# Patient Record
Sex: Female | Born: 1950
Health system: Southern US, Community
[De-identification: ages and names within clinical notes are randomized; demographics above are authoritative.]

## PROBLEM LIST (undated history)

## (undated) DIAGNOSIS — E0590 Thyrotoxicosis, unspecified without thyrotoxic crisis or storm: Secondary | ICD-10-CM

## (undated) HISTORY — DX: Thyrotoxicosis, unspecified without thyrotoxic crisis or storm: E05.90

## (undated) HISTORY — PX: TONSILLECTOMY: SUR1361

---

## 1978-06-13 HISTORY — PX: AUGMENTATION MAMMAPLASTY: SUR837

## 2013-07-01 ENCOUNTER — Other Ambulatory Visit (HOSPITAL_COMMUNITY): Payer: Self-pay | Admitting: *Deleted

## 2013-07-01 DIAGNOSIS — N6310 Unspecified lump in the right breast, unspecified quadrant: Secondary | ICD-10-CM

## 2013-07-02 ENCOUNTER — Encounter (HOSPITAL_COMMUNITY): Payer: Self-pay

## 2013-07-02 ENCOUNTER — Ambulatory Visit (HOSPITAL_COMMUNITY): Payer: Self-pay

## 2013-07-02 ENCOUNTER — Ambulatory Visit (HOSPITAL_COMMUNITY)
Admission: RE | Admit: 2013-07-02 | Discharge: 2013-07-02 | Disposition: A | Discharge status: Home / Self Care | Payer: Self-pay | Source: Ambulatory Visit | Attending: Obstetrics and Gynecology | Admitting: Obstetrics and Gynecology

## 2013-07-02 ENCOUNTER — Encounter (INDEPENDENT_AMBULATORY_CARE_PROVIDER_SITE_OTHER): Payer: Self-pay

## 2013-07-02 VITALS — BP 146/100 | Temp 99.1°F | Ht 64.0 in | Wt 218.6 lb

## 2013-07-02 DIAGNOSIS — Z01419 Encounter for gynecological examination (general) (routine) without abnormal findings: Secondary | ICD-10-CM

## 2013-07-02 DIAGNOSIS — N6310 Unspecified lump in the right breast, unspecified quadrant: Secondary | ICD-10-CM | POA: Insufficient documentation

## 2013-07-02 NOTE — Progress Notes (Signed)
Complaints of a right breast lump x 6 months and new nipple inversion  Pap Smear:  Pap smear completed today. Patients last Pap smear was around 10 years ago per patient and normal. Per patient has no history of an abnormal Pap smear. No Pap smear results in EPIC.  Physical exam: Breasts Patient has a history of bilateral breast implants in 1979 per patient. Breasts symmetrical. No skin abnormalities bilateral breasts. No nipple retraction left breast. Right nipple inversion when lifts arms. No nipple discharge bilateral breasts. No lymphadenopathy left axilla. Lymphadenopathy and redness right axilla. No lumps palpated left breast. Palpated a mass within the right upper outer breast. No complaints of pain or tenderness on exam. Referred patient to the [facility] for diagnostic mammogram. Appointment scheduled for Wednesday, July 10, 2013 at 1030.          Pelvic/Bimanual   Ext Genitalia No lesions, no swelling and no discharge observed on external genitalia.         Vagina Vagina pink and normal texture. No lesions or discharge observed in vagina.          Cervix Cervix is present. Cervix pink and of normal texture. No discharge observed.     Uterus Uterus is present and palpable. Uterus in normal position and normal size.        Adnexae Bilateral ovaries present and palpable. No tenderness on palpation.          Rectovaginal No rectal exam completed today since patient had no rectal complaints. No skin abnormalities observed on exam.

## 2013-07-02 NOTE — Patient Instructions (Signed)
Taught Analysse Quinonez how to perform BSE and gave educational materials to take home. Let her know [facility] will cover Pap smears every 3 years unless has a history of abnormal Pap smears. Referred patient to the [facility] for diagnostic mammogram. Appointment scheduled for Wednesday, July 10, 2013 at 1030. Patient aware of appointment and will be there. Let patient know will follow up with her within the next couple weeks with results of Pap smear by phone. Loneta Tamplin verbalized understanding.  Meg Niemeier, Arvil Chaco, RN 4:25 PM

## 2013-07-03 ENCOUNTER — Encounter (HOSPITAL_COMMUNITY): Payer: Self-pay

## 2013-07-10 ENCOUNTER — Ambulatory Visit
Admission: RE | Admit: 2013-07-10 | Discharge: 2013-07-10 | Disposition: A | Discharge status: Home / Self Care | Payer: No Typology Code available for payment source | Source: Ambulatory Visit | Attending: Obstetrics and Gynecology | Admitting: Obstetrics and Gynecology

## 2013-07-10 ENCOUNTER — Other Ambulatory Visit (HOSPITAL_COMMUNITY): Payer: Self-pay | Admitting: Obstetrics and Gynecology

## 2013-07-10 DIAGNOSIS — N6310 Unspecified lump in the right breast, unspecified quadrant: Secondary | ICD-10-CM

## 2013-09-11 ENCOUNTER — Telehealth (HOSPITAL_COMMUNITY): Payer: Self-pay | Admitting: *Deleted

## 2013-09-11 NOTE — Telephone Encounter (Signed)
Telephoned patient at home # and discussed negative pap smear result. Next pap smear due in 3 years. Patient voiced understanding.

## 2014-04-14 ENCOUNTER — Encounter (HOSPITAL_COMMUNITY): Payer: Self-pay

## 2016-12-31 ENCOUNTER — Encounter (HOSPITAL_COMMUNITY): Payer: Self-pay | Admitting: Emergency Medicine

## 2016-12-31 ENCOUNTER — Emergency Department (HOSPITAL_COMMUNITY): Payer: Medicare Other

## 2016-12-31 ENCOUNTER — Ambulatory Visit (INDEPENDENT_AMBULATORY_CARE_PROVIDER_SITE_OTHER): Payer: Medicare Other | Admitting: Physician Assistant

## 2016-12-31 ENCOUNTER — Other Ambulatory Visit: Payer: Self-pay

## 2016-12-31 ENCOUNTER — Emergency Department (HOSPITAL_COMMUNITY)
Admission: EM | Admit: 2016-12-31 | Discharge: 2016-12-31 | Disposition: A | Discharge status: Home / Self Care | Payer: Medicare Other | Attending: Emergency Medicine | Admitting: Emergency Medicine

## 2016-12-31 ENCOUNTER — Encounter: Payer: Self-pay | Admitting: Physician Assistant

## 2016-12-31 VITALS — BP 150/90 | HR 125 | Temp 98.8°F | Resp 18 | Ht 64.96 in | Wt 222.0 lb

## 2016-12-31 DIAGNOSIS — R600 Localized edema: Secondary | ICD-10-CM | POA: Diagnosis not present

## 2016-12-31 DIAGNOSIS — R000 Tachycardia, unspecified: Secondary | ICD-10-CM | POA: Diagnosis not present

## 2016-12-31 DIAGNOSIS — R002 Palpitations: Secondary | ICD-10-CM

## 2016-12-31 DIAGNOSIS — E0590 Thyrotoxicosis, unspecified without thyrotoxic crisis or storm: Secondary | ICD-10-CM | POA: Diagnosis not present

## 2016-12-31 DIAGNOSIS — I10 Essential (primary) hypertension: Secondary | ICD-10-CM | POA: Diagnosis not present

## 2016-12-31 DIAGNOSIS — R0600 Dyspnea, unspecified: Secondary | ICD-10-CM | POA: Insufficient documentation

## 2016-12-31 DIAGNOSIS — R5383 Other fatigue: Secondary | ICD-10-CM

## 2016-12-31 DIAGNOSIS — R030 Elevated blood-pressure reading, without diagnosis of hypertension: Secondary | ICD-10-CM | POA: Diagnosis not present

## 2016-12-31 LAB — I-STAT TROPONIN, ED: Troponin i, poc: 0.03 ng/mL (ref 0.00–0.08)

## 2016-12-31 LAB — TSH: TSH: <0.01 u[IU]/mL — ABNORMAL LOW (ref 0.350–4.500)

## 2016-12-31 LAB — CBC
HCT: 35.9 % — ABNORMAL LOW (ref 36.0–46.0)
Hemoglobin: 11.5 g/dL — ABNORMAL LOW (ref 12.0–15.0)
MCH: 29.4 pg (ref 26.0–34.0)
MCHC: 32 g/dL (ref 30.0–36.0)
MCV: 91.8 fL (ref 78.0–100.0)
PLATELETS: 205 10*3/uL (ref 150–400)
RBC: 3.91 MIL/uL (ref 3.87–5.11)
RDW: 12.2 % (ref 11.5–15.5)
WBC: 6.4 10*3/uL (ref 4.0–10.5)

## 2016-12-31 LAB — BASIC METABOLIC PANEL
Anion gap: 9 (ref 5–15)
BUN: 11 mg/dL (ref 6–20)
CO2: 23 mmol/L (ref 22–32)
Calcium: 9.4 mg/dL (ref 8.9–10.3)
Chloride: 108 mmol/L (ref 101–111)
Creatinine, Ser: 0.63 mg/dL (ref 0.44–1.00)
GFR calc non Af Amer: >60 mL/min (ref >60)
Glucose, Bld: 84 mg/dL (ref 65–99)
Potassium: 3.8 mmol/L (ref 3.5–5.1)
Sodium: 140 mmol/L (ref 135–145)

## 2016-12-31 LAB — D-DIMER, QUANTITATIVE (NOT AT ARMC): D DIMER QUANT: 1.08 ug{FEU}/mL — AB (ref 0.00–0.50)

## 2016-12-31 MED ORDER — IOPAMIDOL (ISOVUE-370) INJECTION 76%
INTRAVENOUS | Status: AC
  Administered 2016-12-31: 100 mL
  Filled 2016-12-31: qty 100

## 2016-12-31 MED ORDER — METOPROLOL SUCCINATE ER 50 MG PO TB24
50.0000 mg | ORAL_TABLET | Freq: Every day | ORAL | Status: DC
  Administered 2016-12-31: 50 mg via ORAL
  Filled 2016-12-31: qty 1

## 2016-12-31 MED ORDER — METOPROLOL SUCCINATE ER 50 MG PO TB24: 50.0000 mg | ORAL_TABLET | Freq: Every day | ORAL | 1 refills | Status: DC

## 2016-12-31 NOTE — Discharge Instructions (Signed)
I'm concerned that you have hyperthyroidism which is leading to a rapid heart rate. Will prescribe a beta blocker called metoprolol. Recommend following up with an endocrinologist.  Options include [facility] at [phone],  Dr Legrand Como [place] [phone], Dr Roque Cash [phone]

## 2016-12-31 NOTE — Patient Instructions (Addendum)
I would like you to go to [facility] [address] today.      IF you received an x-ray today, you will receive an invoice from [facility] Radiology. Please contact [facility]iology at [phone] with questions or concerns regarding your invoice.   IF you received labwork today, you will receive an invoice from [facility]. Please contact [facility] at [phone] with questions or concerns regarding your invoice.   Our billing staff will not be able to assist you with questions regarding bills from these companies.  You will be contacted with the lab results as soon as they are available. The fastest way to get your results is to activate your My Chart account. Instructions are located on the last page of this paperwork. If you have not heard from [place] regarding the results in 2 weeks, please contact this office.

## 2016-12-31 NOTE — ED Triage Notes (Signed)
[facility] further eval of B/L ankle swelling and irregular heart beat.

## 2016-12-31 NOTE — Progress Notes (Signed)
PRIMARY CARE AT [place], [place] [address] [phone]  Date:  12/31/2016   Name:  Misty Cabrera   DOB:  [birth year]   MRN:  [id number]  PCP:  No primary care provider on file.    History of Present Illness:  Misty Cabrera is a 66 y.o. female patient who presents to PCP with  Chief Complaint  Patient presents with  . Joint Swelling    x 2 days       Yesterday, she noticed left foot swelling.  She now has right foot swelling by this morning.  The sensation is tight but not painful.   Chest discomfort, like palpitations not associated with rest,  No nausea, no sweating.  She has no dizziness.  No actual chest pain.   She has dyspnea during sleep at times.  She sleeps with 2 pillows at times chronically.  She has noticed a dry tickle in her throat, but not heavy coughing.  No abnormal dyspnea or sob.  She does drink energy drinks 1 per day.  She has had one about 4 hours ago. Her job involves her sitting all the times.   She does not regularly see a doctor.   Mother hx of irregular heart beat, deceased.   Father hx unknown.   Rare etOH use.   No tobacco use.  She does not go to a primary provider, and has not had her health evaluated for years. 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    Patient Active Problem List   Diagnosis Date Noted  . Mass of right breast 07/02/2013    History reviewed. No pertinent past medical history.  Past Surgical History:  Procedure Laterality Date  . AUGMENTATION MAMMAPLASTY  1980  . TONSILLECTOMY      Social History  Substance Use Topics  . Smoking status: Never Smoker  . Smokeless tobacco: Never Used  . Alcohol use No    Family History  Problem Relation Age of Onset  . Diabetes Maternal Aunt   . Diabetes Maternal Uncle     No Known Allergies  Medication list has been reviewed and updated.  No current outpatient prescriptions on file prior to visit.   No current  facility-administered medications on file prior to visit.     ROS ROS otherwise unremarkable unless listed above.  Physical Examination: BP (!) 150/90   Pulse (!) 125   Temp 98.8 F (37.1 C) (Oral)   Resp 18   Ht 5' 4.96" (1.65 m)   Wt 222 lb (100.7 kg)   SpO2 95%   BMI 36.99 kg/m  Ideal Body Weight: Weight in (lb) to have BMI = 25: 149.7  Physical Exam  Constitutional: She is oriented to person, place, and time. She appears well-developed and well-nourished. No distress.  HENT:  Head: Normocephalic and atraumatic.  Right Ear: External ear normal.  Left Ear: External ear normal.  Eyes: Pupils are equal, round, and reactive to light. Conjunctivae and EOM are normal.  Cardiovascular: Regular rhythm.  Tachycardia present.  Exam reveals no gallop and no friction rub.   No murmur heard. Pulses:      Dorsalis pedis pulses are 2+ on the right side, and 2+ on the left side.  Lower extremity non-pitting edema.    Pulmonary/Chest: Effort normal. No apnea. No respiratory distress. She has no decreased breath sounds. She has no wheezes. She has no rhonchi.  Neurological: She is alert and oriented to person,  place, and time.  Skin: She is not diaphoretic.  Psychiatric: She has a normal mood and affect. Her behavior is normal.     Assessment and Plan: Misty Cabrera is a 66 y.o. female who is here today for cc of lower extremity swelling. This ekg reviewed with Dr. Linna Darner.  Possible tachycardia without atrial flutter.   Heart rate should be controlled and extremity swelling.  Possible clot can not be ruled out as well  Lower extremity edema - Plan: CBC, CMP14+EGFR, Brain natriuretic peptide, TSH  Palpitations - Plan: EKG 12-Lead, CBC, CMP14+EGFR, Brain natriuretic peptide, TSH  Other fatigue - Plan: EKG 12-Lead, CBC, CMP14+EGFR, Brain natriuretic peptide, TSH  Essential hypertension  Elevated blood pressure reading - Plan: CBC, CMP14+EGFR, Brain natriuretic peptide,  TSH  Tachycardia  Ivar Drape, PA-C [facility] 7/21/20184:07 PM

## 2016-12-31 NOTE — ED Provider Notes (Signed)
[facility] DEPT Provider Note   CSN: [id number] Arrival date & time: 12/31/16  1616     History   Chief Complaint Chief Complaint  Patient presents with  . Irregular Heart Beat  . Joint Swelling    HPI Misty Cabrera is a 66 y.o. female.  Patient complains of swelling in her lower extremities and a rapid heart rate with dyspnea. She is feels stressed out lately. No chest pain, fever, sweats, chills.  She was sent from an urgent care center for further evaluation. Unknown past medical history.  Medications include Naprosyn. Her symptoms are new. Severity is moderate.       History reviewed. No pertinent past medical history.  Patient Active Problem List   Diagnosis Date Noted  . Mass of right breast 07/02/2013    Past Surgical History:  Procedure Laterality Date  . AUGMENTATION MAMMAPLASTY  1980  . TONSILLECTOMY      OB History    Gravida Para Term Preterm AB Living   1 1 1     1    SAB TAB Ectopic Multiple Live Births                   Home Medications    Prior to Admission medications   Medication Sig Start Date End Date Taking? Authorizing Provider  naproxen sodium (ALEVE) 220 MG tablet Take 660 mg by mouth 3 (three) times daily as needed (pain/headache).   Yes [provider]  metoprolol succinate (TOPROL XL) 50 MG 24 hr tablet Take 1 tablet (50 mg total) by mouth daily. Take with or immediately following a meal. 12/31/16   Nat Christen, MD    Family History Family History  Problem Relation Age of Onset  . Diabetes Maternal Aunt   . Diabetes Maternal Uncle     Social History Social History  Substance Use Topics  . Smoking status: Never Smoker  . Smokeless tobacco: Never Used  . Alcohol use No     Allergies   Patient has no known allergies.   Review of Systems Review of Systems  All other systems reviewed and are negative.    Physical Exam Updated Vital Signs BP (!) 143/82   Pulse (!) 121   Temp 98.7 F (37.1 C) (Oral)    Resp 20   Ht 5\' 5"  (1.651 m)   Wt 100.7 kg (222 lb)   SpO2 94%   BMI 36.94 kg/m   Physical Exam  Constitutional: She is oriented to person, place, and time. She appears well-developed and well-nourished.  No acute distress  HENT:  Head: Normocephalic and atraumatic.  Eyes: Conjunctivae are normal.  Neck: Neck supple.  Cardiovascular: Regular rhythm.   Tachycardic  Pulmonary/Chest: Effort normal and breath sounds normal.  Abdominal: Soft. Bowel sounds are normal.  Musculoskeletal: Normal range of motion.  Neurological: She is alert and oriented to person, place, and time.  Skin:  3+ peripheral edema  Psychiatric: She has a normal mood and affect. Her behavior is normal.  Nursing note and vitals reviewed.    ED Treatments / Results  Labs (all labs ordered are listed, but only abnormal results are displayed) Labs Reviewed  CBC - Abnormal; Notable for the following:       Result Value   Hemoglobin 11.5 (*)    HCT 35.9 (*)    All other components within normal limits  TSH - Abnormal; Notable for the following:    TSH <0.010 (*)    All other components within normal  limits  D-DIMER, QUANTITATIVE (NOT AT [facility]) - Abnormal; Notable for the following:    D-Dimer, Quant 1.08 (*)    All other components within normal limits  BASIC METABOLIC PANEL  I-STAT TROPONIN, ED    EKG  EKG Interpretation  Date/Time:  Saturday December 31 2016 16:21:06 EDT Ventricular Rate:  135 PR Interval:  130 QRS Duration: 74 QT Interval:  284 QTC Calculation: 426 R Axis:   41 Text Interpretation:  Sinus tachycardia Possible Left atrial enlargement Borderline ECG Confirmed by Nat Christen [phone]) on 12/31/2016 4:29:45 PM       Radiology Dg Chest 2 View  Result Date: 12/31/2016 CLINICAL DATA:  Irregular heartbeat, bilateral ankle swelling EXAM: CHEST  2 VIEW COMPARISON:  None. FINDINGS: Lungs are clear.  No pleural effusion or pneumothorax. The heart is normal in size. Bilateral breast  augmentation. Visualized osseous structures are within normal limits. IMPRESSION: No evidence of acute cardiopulmonary disease. Electronically Signed   By: Julian Hy M.D.   On: 12/31/2016 17:12   Ct Angio Chest Pe W And/or Wo Contrast  Result Date: 12/31/2016 CLINICAL DATA:  Unexplained tachycardia and peripheral edema. Evaluate for pulmonary embolism. EXAM: CT ANGIOGRAPHY CHEST WITH CONTRAST TECHNIQUE: Multidetector CT imaging of the chest was performed using the standard protocol during bolus administration of intravenous contrast. Multiplanar CT image reconstructions and MIPs were obtained to evaluate the vascular anatomy. CONTRAST:  100 cc Isovue 370 intravenous COMPARISON:  None. FINDINGS: Cardiovascular: Diminished sensitivity due to bolus dispersion, intermittent streak artifact, and intermittent motion but there is adequate opacification to the segmental level at least. No visible pulmonary embolism. Normal heart size. No pericardial effusion. No acute finding in the aorta. Mediastinum/Nodes: Negative for adenopathy or mass. Lungs/Pleura: There is no edema, consolidation, effusion, or pneumothorax. Upper Abdomen: 1 cm low-density in the central liver measures cystic density. Musculoskeletal: No acute or aggressive finding. Bilateral subglandular breast implants with intracapsular rupture. Review of the MIP images confirms the above findings. IMPRESSION: Negative for pulmonary embolism or other acute finding. Electronically Signed   By: Monte Fantasia M.D.   On: 12/31/2016 19:52    Procedures Procedures (including critical care time)  Medications Ordered in ED Medications  metoprolol succinate (TOPROL-XL) 24 hr tablet 50 mg (not administered)  iopamidol (ISOVUE-370) 76 % injection (100 mLs  Contrast Given 12/31/16 1929)     Initial Impression / Assessment and Plan / ED Course  I have reviewed the triage vital signs and the nursing notes.  Pertinent labs & imaging results that were  available during my care of the patient were reviewed by me and considered in my medical decision making (see chart for details).     Patient is tachycardic with a rate of 120-135. Chest x-ray shows no edema. CT angiogram of chest reveals no pulmonary embolism. TSH was ultra low. Suspect hyperthyroidism. Will start metoprolol extended release 50 mg daily. Referral to endocrinologist. This was discussed with the patient and her family.  Final Clinical Impressions(s) / ED Diagnoses   Final diagnoses:  Tachycardia  Hyperthyroidism    New Prescriptions New Prescriptions   METOPROLOL SUCCINATE (TOPROL XL) 50 MG 24 HR TABLET    Take 1 tablet (50 mg total) by mouth daily. Take with or immediately following a meal.     Nat Christen, MD 12/31/16 2214

## 2017-01-02 LAB — CMP14+EGFR
ALBUMIN: 4 g/dL (ref 3.6–4.8)
ALK PHOS: 81 IU/L (ref 39–117)
ALT: 87 IU/L — ABNORMAL HIGH (ref 0–32)
AST: 51 IU/L — ABNORMAL HIGH (ref 0–40)
Albumin/Globulin Ratio: 1.5 (ref 1.2–2.2)
BUN / CREAT RATIO: 19 (ref 12–28)
BUN: 12 mg/dL (ref 8–27)
Bilirubin Total: 0.4 mg/dL (ref 0.0–1.2)
CHLORIDE: 105 mmol/L (ref 96–106)
CO2: 23 mmol/L (ref 20–29)
Calcium: 9.8 mg/dL (ref 8.7–10.3)
Creatinine, Ser: 0.63 mg/dL (ref 0.57–1.00)
GFR calc Af Amer: 108 mL/min/{1.73_m2} (ref >59)
GFR calc non Af Amer: 94 mL/min/{1.73_m2} (ref >59)
GLOBULIN, TOTAL: 2.6 g/dL (ref 1.5–4.5)
Glucose: 84 mg/dL (ref 65–99)
Potassium: 3.9 mmol/L (ref 3.5–5.2)
Sodium: 144 mmol/L (ref 134–144)
Total Protein: 6.6 g/dL (ref 6.0–8.5)

## 2017-01-02 LAB — CBC
Hematocrit: 36.2 % (ref 34.0–46.6)
Hemoglobin: 12 g/dL (ref 11.1–15.9)
MCH: 30.5 pg (ref 26.6–33.0)
MCHC: 33.1 g/dL (ref 31.5–35.7)
MCV: 92 fL (ref 79–97)
Platelets: 253 10*3/uL (ref 150–379)
RBC: 3.94 x10E6/uL (ref 3.77–5.28)
RDW: 12.3 % (ref 12.3–15.4)
WBC: 5.8 10*3/uL (ref 3.4–10.8)

## 2017-01-02 LAB — TSH: TSH: <0.006 u[IU]/mL — ABNORMAL LOW (ref 0.450–4.500)

## 2017-01-02 LAB — BRAIN NATRIURETIC PEPTIDE: BNP: 259 pg/mL — ABNORMAL HIGH (ref 0.0–100.0)

## 2017-02-10 ENCOUNTER — Encounter: Payer: Self-pay | Admitting: Endocrinology

## 2017-02-10 ENCOUNTER — Ambulatory Visit (INDEPENDENT_AMBULATORY_CARE_PROVIDER_SITE_OTHER): Payer: Medicare Other | Admitting: Endocrinology

## 2017-02-10 DIAGNOSIS — E0590 Thyrotoxicosis, unspecified without thyrotoxic crisis or storm: Secondary | ICD-10-CM | POA: Diagnosis not present

## 2017-02-10 MED ORDER — METHIMAZOLE 10 MG PO TABS: 20.0000 mg | ORAL_TABLET | Freq: Three times a day (TID) | ORAL | 5 refills | Status: DC

## 2017-02-10 NOTE — Progress Notes (Signed)
Subjective:    Patient ID: Misty Cabrera, female    DOB: Jul 02, 1950, 66 y.o.   MRN: [id number]  HPI Pt is self-referred by for hyperthyroidism.  Pt reports she was dx'ed with hyperthyroidism in mid-2018, when she went to the ER.  she has never been on anti-thyroid rx.  she has never had XRT to the anterior neck, or thyroid surgery.  she has never had thyroid imaging.  she does not consume kelp or any other prescribed or non-prescribed thyroid medication.  she has never been on amiodarone.  She had moderate palpitations in the chest, and assoc leg swelling.  She was rx'ed b-blocker, and she feels better now.   Past Medical History:  Diagnosis Date  . Hyperthyroidism     Past Surgical History:  Procedure Laterality Date  . AUGMENTATION MAMMAPLASTY  1980  . TONSILLECTOMY      Social History   Social History  . Marital status: Divorced    Spouse name: N/A  . Number of children: N/A  . Years of education: N/A   Occupational History  . Not on file.   Social History Main Topics  . Smoking status: Never Smoker  . Smokeless tobacco: Never Used  . Alcohol use No  . Drug use: No  . Sexual activity: Not Currently    Birth control/ protection: None   Other Topics Concern  . Not on file   Social History Narrative  . No narrative on file    Current Outpatient Prescriptions on File Prior to Visit  Medication Sig Dispense Refill  . metoprolol succinate (TOPROL XL) 50 MG 24 hr tablet Take 1 tablet (50 mg total) by mouth daily. Take with or immediately following a meal. 30 tablet 1  . naproxen sodium (ALEVE) 220 MG tablet Take 660 mg by mouth 3 (three) times daily as needed (pain/headache).     No current facility-administered medications on file prior to visit.     No Known Allergies  Family History  Problem Relation Age of Onset  . Diabetes Maternal Aunt   . Diabetes Maternal Uncle   . Goiter Mother     BP 118/72   Pulse (!) 120   Wt 219 lb 6.4 oz (99.5 kg)   SpO2 97%    BMI 36.51 kg/m     Review of Systems denies weight loss, headache, hoarseness, diplopia, sob, diarrhea, muscle weakness, excessive diaphoresis, easy bruising, and rhinorrhea. She has tremor, cough, heat intolerance, anxiety, fatigue, insomnia, nocturia, and arthralgias.       Objective:   Physical Exam VS: see vs page GEN: no distress HEAD: head: no deformity eyes: no periorbital swelling, no proptosis external nose and ears are normal mouth: no lesion seen NECK: thyroid is 2-3 times normal, (R>L), with multinodular surface CHEST WALL: no deformity LUNGS: clear to auscultation.  CV: reg rate and rhythm, no murmur.  ABD: abdomen is soft, nontender.  no hepatosplenomegaly.  not distended.  no hernia.  MUSCULOSKELETAL: muscle bulk and strength are grossly normal.  no obvious joint swelling.  gait is normal and steady EXTEMITIES: no deformity.  no ulcer on the feet.  feet are of normal color and temp.  1+ bilat leg edema.  PULSES: dorsalis pedis intact bilat.  no carotid bruit.  NEURO:  cn 2-12 grossly intact.   readily moves all 4's.  sensation is intact to touch on the feet.  No tremor SKIN:  Normal texture and temperature.  No rash or suspicious lesion is visible.  Not  diaphoretic NODES:  None palpable at the neck.   PSYCH: alert, well-oriented.  Does not appear anxious nor depressed.   Lab Results  Component Value Date   TSH <0.010 (L) 12/31/2016   CT: no mention is made of the thyroid.   I personally reviewed electrocardiogram tracing (12/31/16): Indication: tachycardia Impression: ST.  No MI.  No hypertrophy. Compared to earlier same day: A-flutter is resolved     Assessment & Plan:  Hyperthyroidism, new, prob due to multinodular goiter.   Patient Instructions  I have sent a prescription to your pharmacy, to slow the thyroid. If ever you have fever while taking methimazole, stop it and call us, even if the reason is obvious, because of the risk of a rare  side-effect. Please come back for a follow-up appointment in 1 month.  Please let me know if you want to get the ultrasound checked.

## 2017-02-10 NOTE — Patient Instructions (Signed)
I have sent a prescription to your pharmacy, to slow the thyroid. If ever you have fever while taking methimazole, stop it and call us, even if the reason is obvious, because of the risk of a rare side-effect. Please come back for a follow-up appointment in 1 month.  Please let me know if you want to get the ultrasound checked.

## 2017-02-12 ENCOUNTER — Encounter: Payer: Self-pay | Admitting: Endocrinology

## 2017-02-12 DIAGNOSIS — E0590 Thyrotoxicosis, unspecified without thyrotoxic crisis or storm: Secondary | ICD-10-CM | POA: Insufficient documentation

## 2017-02-23 ENCOUNTER — Encounter: Payer: Self-pay | Admitting: Family Medicine

## 2017-02-23 ENCOUNTER — Ambulatory Visit (INDEPENDENT_AMBULATORY_CARE_PROVIDER_SITE_OTHER): Payer: Medicare Other | Admitting: Family Medicine

## 2017-02-23 VITALS — BP 155/89 | HR 114 | Temp 100.3°F | Resp 18 | Ht 65.0 in | Wt 218.2 lb

## 2017-02-23 DIAGNOSIS — E0590 Thyrotoxicosis, unspecified without thyrotoxic crisis or storm: Secondary | ICD-10-CM

## 2017-02-23 DIAGNOSIS — Z79899 Other long term (current) drug therapy: Secondary | ICD-10-CM

## 2017-02-23 DIAGNOSIS — R509 Fever, unspecified: Secondary | ICD-10-CM | POA: Diagnosis not present

## 2017-02-23 LAB — POCT CBC
Granulocyte percent: 63.3 %G (ref 37–80)
HCT, POC: 38.2 % (ref 37.7–47.9)
Hemoglobin: 12.6 g/dL (ref 12.2–16.2)
Lymph, poc: 1.5 (ref 0.6–3.4)
MCH, POC: 28.8 pg (ref 27–31.2)
MCHC: 32.9 g/dL (ref 31.8–35.4)
MCV: 87.5 fL (ref 80–97)
MID (cbc): 0.5 (ref 0–0.9)
MPV: 8.4 fL (ref 0–99.8)
POC Granulocyte: 3.6 (ref 2–6.9)
POC LYMPH PERCENT: 27.1 %L (ref 10–50)
POC MID %: 9.6 %M (ref 0–12)
Platelet Count, POC: 235 10*3/uL (ref 142–424)
RBC: 4.37 M/uL (ref 4.04–5.48)
RDW, POC: 12.7 %
WBC: 5.7 10*3/uL (ref 4.6–10.2)

## 2017-02-23 LAB — POCT RAPID STREP A (OFFICE): Rapid Strep A Screen: NEGATIVE

## 2017-02-23 MED ORDER — CEPHALEXIN 500 MG PO CAPS: 500.0000 mg | ORAL_CAPSULE | Freq: Four times a day (QID) | ORAL | 0 refills | Status: AC

## 2017-02-23 MED ORDER — DIPHENHYDRAMINE HCL 25 MG PO TABS: 25.0000 mg | ORAL_TABLET | Freq: Four times a day (QID) | ORAL | 0 refills | Status: DC | PRN

## 2017-02-23 MED ORDER — METOPROLOL SUCCINATE ER 25 MG PO TB24: 25.0000 mg | ORAL_TABLET | Freq: Every day | ORAL | 0 refills | Status: DC

## 2017-02-23 MED ORDER — METHIMAZOLE 10 MG PO TABS: 10.0000 mg | ORAL_TABLET | Freq: Two times a day (BID) | ORAL | 5 refills | Status: DC

## 2017-02-23 NOTE — Patient Instructions (Addendum)
IF you received an x-ray today, you will receive an invoice from [facility] Radiology. Please contact [facility] Radiology at [phone] with questions or concerns regarding your invoice.   IF you received labwork today, you will receive an invoice from [facility]. Please contact [facility] at [phone] with questions or concerns regarding your invoice.   Our billing staff will not be able to assist you with questions regarding bills from these companies.  You will be contacted with the lab results as soon as they are available. The fastest way to get your results is to activate your My Chart account. Instructions are located on the last page of this paperwork. If you have not heard from [place] regarding the results in 2 weeks, please contact this office.    Methimazole tablets What is this medicine? METHIMAZOLE (meth IM a zole) prevents the thyroid gland from producing too much thyroid hormone. It is used to treat a condition known as hyperthyroidism. This medicine may be used for other purposes; ask your health care provider or pharmacist if you have questions. COMMON BRAND NAME(S): Northyx, Tapazole What should I tell my health care provider before I take this medicine? They need to know if you have any of these conditions: -bone marrow disease -liver disease -an unusual or allergic reaction to methimazole, other medicines, foods, dyes, or preservatives -pregnant or trying to get pregnant -breast-feeding How should I use this medicine? Take this medicine by mouth with a glass of water. Follow the directions on the prescription label. You can take this medicine with or without food. However, you should always take it the same way to make sure the effects are the same. Take your doses at regular intervals. Do not take your medicine more often than directed. Do not stop taking this medicine except on the advice of your doctor or health care professional. Talk to your pediatrician  regarding the use of this medicine in children. Special care may be needed. While this drug may be prescribed for children for selected conditions, precautions do apply. Overdosage: If you think you have taken too much of this medicine contact a poison control center or emergency room at once. NOTE: This medicine is only for you. Do not share this medicine with others. What if I miss a dose? If you miss a dose, take it as soon as you can. If it is almost time for your next dose, take only that dose. Do not take double or extra doses. What may interact with this medicine? Do not take this medicine with any of the following medications: -sodium iodide -thyroid hormones This medicine may also interact with the following medications: -certain medicines for high blood pressure like metoprolol and propranolol -digoxin -theophylline -warfarin This list may not describe all possible interactions. Give your health care provider a list of all the medicines, herbs, non-prescription drugs, or dietary supplements you use. Also tell them if you smoke, drink alcohol, or use illegal drugs. Some items may interact with your medicine. What should I watch for while using this medicine? Visit your doctor or health care professional for regular checks on your progress. Your thyroid hormone levels will need to be checked. This medicine can reduce your resistance to infection. Contact your doctor or health care professional if you have any infection or injury. Avoid people who have colds, flu, bronchitis or other infectious disease. Do not have any vaccinations without asking your doctor or health care professional. Avoid people who have recently received oral polio vaccine.  What side effects may I notice from receiving this medicine? Side effects that you should report to your doctor or health care professional as soon as possible: -black, tarry stools -fever, sore throat, hoarseness -numbness or tingling in the  hands or feet -severe redness or itching of the skin, or dry cracked skin -stomach pain -swelling of the feet or legs -unusual bleeding or bruising, pinpoint red spots on the skin -unusual or sudden weight increase -unusually weak or tired -yellowing of skin or eyes Side effects that usually do not require medical attention (report to your doctor or health care professional if they continue or are bothersome): -headache -nausea, vomiting -mild skin rash, itching -muscle aches and pains This list may not describe all possible side effects. Call your doctor for medical advice about side effects. You may report side effects to FDA at 1-800-FDA-1088. Where should I keep my medicine? Keep out of the reach of children. Store at room temperature between 15 and 30 degrees C (59 and 86 degrees F). Throw away any unused medicine after the expiration date. NOTE: This sheet is a summary. It may not cover all possible information. If you have questions about this medicine, talk to your doctor, pharmacist, or health care provider.  2018 Elsevier/Gold Standard (2007-12-17 15:59:43)

## 2017-02-23 NOTE — Progress Notes (Signed)
9/13/20182:40 PM  Misty Cabrera 30-Dec-1950, 66 y.o. female [id number]  Chief Complaint  Patient presents with  . Eye Problem    woke up to a swollen left eye and was shut but has gone down some     HPI:   Patient is a 66 y.o. female with past medical history significant for recently diagnosed hyperthyroidism treated with methimazole who presents today for left swollen eye, fells feverish, lower extremity edema. She reports taking methimazole 20mg  BID, she stopped metoprolol. Reports improvement in palpitations as long as she does not drink coffee but still has tremors which interferes with her job.   Depression screen [facility] 2/9 02/23/2017 12/31/2016  Decreased Interest 0 0  Down, Depressed, Hopeless 0 0  PHQ - 2 Score 0 0    No Known Allergies  Current Outpatient Prescriptions on File Prior to Visit  Medication Sig Dispense Refill  . methimazole (TAPAZOLE) 10 MG tablet Take 2 tablets (20 mg total) by mouth 3 (three) times daily. (Patient taking differently: Take 20 mg by mouth 2 (two) times daily. ) 120 tablet 5  . metoprolol succinate (TOPROL XL) 50 MG 24 hr tablet Take 1 tablet (50 mg total) by mouth daily. Take with or immediately following a meal. (Patient not taking: Reported on 02/23/2017) 30 tablet 1  . naproxen sodium (ALEVE) 220 MG tablet Take 660 mg by mouth 3 (three) times daily as needed (pain/headache).     No current facility-administered medications on file prior to visit.     Past Medical History:  Diagnosis Date  . Hyperthyroidism     Past Surgical History:  Procedure Laterality Date  . AUGMENTATION MAMMAPLASTY  1980  . TONSILLECTOMY      Social History  Substance Use Topics  . Smoking status: Never Smoker  . Smokeless tobacco: Never Used  . Alcohol use No    Family History  Problem Relation Age of Onset  . Diabetes Maternal Aunt   . Diabetes Maternal Uncle   . Goiter Mother     Review of Systems  Constitutional: Positive for fever. Negative for  chills.  Respiratory: Positive for cough. Negative for shortness of breath.   Cardiovascular: Positive for palpitations and leg swelling. Negative for chest pain.  Gastrointestinal: Negative for abdominal pain, diarrhea, nausea and vomiting.  Skin: Negative for rash.     OBJECTIVE:  Blood pressure (!) 155/89, pulse (!) 114, temperature 100.3 F (37.9 C), temperature source Oral, resp. rate 18, height 5\' 5"  (1.651 m), weight 218 lb 3.2 oz (99 kg), SpO2 98 %.  Physical Exam  Constitutional: She is oriented to person, place, and time and well-developed, well-nourished, and in no distress.  HENT:  Head: Normocephalic and atraumatic.  Right Ear: Tympanic membrane, external ear and ear canal normal.  Left Ear: Tympanic membrane, external ear and ear canal normal.  Mouth/Throat: Oropharynx is clear and moist. No oropharyngeal exudate.  Eyes: Pupils are equal, round, and reactive to light. Conjunctivae and EOM are normal. No scleral icterus.  Left eye with mild upper and lower eyelid swelling but no erythema or warmth, non tender  Neck: Neck supple. No thyromegaly present.  Thyroid non tender, no nodules palpated  Cardiovascular: Normal rate, regular rhythm and normal heart sounds.  Exam reveals no gallop and no friction rub.   No murmur heard. Pulmonary/Chest: Effort normal and breath sounds normal. She has no wheezes. She has no rales.  Musculoskeletal: She exhibits edema.  Neurological: She is alert and oriented to person, place,  and time. Gait normal.  Skin: Skin is warm and dry.    Results for orders placed or performed in visit on 02/23/17  POCT CBC  Result Value Ref Range   WBC 5.7 4.6 - 10.2 K/uL   Lymph, poc 1.5 0.6 - 3.4   POC LYMPH PERCENT 27.1 10 - 50 %L   MID (cbc) 0.5 0 - 0.9   POC MID % 9.6 0 - 12 %M   POC Granulocyte 3.6 2 - 6.9   Granulocyte percent 63.3 37 - 80 %G   RBC 4.37 4.04 - 5.48 M/uL   Hemoglobin 12.6 12.2 - 16.2 g/dL   HCT, POC 38.2 37.7 - 47.9 %   MCV  87.5 80 - 97 fL   MCH, POC 28.8 27 - 31.2 pg   MCHC 32.9 31.8 - 35.4 g/dL   RDW, POC 12.7 %   Platelet Count, POC 235 142 - 424 K/uL   MPV 8.4 0 - 99.8 fL  Rapid Strep A  Result Value Ref Range   Rapid Strep A Screen Negative Negative     ASSESSMENT and PLAN:  1. Hyperthyroidism Discussed common side effects of methimazole such as edema. CBC normal. No signs of infection on exam. Decreasing methimazole to 10mg  BID to see if better tolerated. However patient still symptomatic with elevated BP and HR, tremors, therefore adding low dose BB. Discussed with patient ssx for which to seek immediate medical care. Otherwise call or fu with endo in 1 week.   2. Fever, unspecified See above - POCT CBC - Rapid Strep A  3. High risk medication use See above - POCT CBC - Rapid Strep A      Rutherford Guys, MD Primary Care at [place], [place] [address] Ph.  [phone] Fax [phone]

## 2017-02-24 ENCOUNTER — Ambulatory Visit: Payer: Medicare Other | Admitting: Family Medicine

## 2017-02-27 ENCOUNTER — Telehealth: Payer: Self-pay | Admitting: Endocrinology

## 2017-02-27 NOTE — Telephone Encounter (Signed)
Patient called in reference to going to going to urgent care Thursday. Doctor at urgent care cut thyroid medicine in half and also gave patient beta blockers for heart at 25 mg. Patient was told to inform Dr. Loanne Drilling of this. Patient also mentioned a cough she forgot to mention at her last visit. Patient stated this is coming from her thyroid. Please call patient with any questions. OK to leave message.

## 2017-02-27 NOTE — Telephone Encounter (Signed)
Patient notified to continue same medication.

## 2017-02-27 NOTE — Telephone Encounter (Signed)
Please continue the same medication. I'll see you next time.

## 2017-03-02 ENCOUNTER — Ambulatory Visit: Payer: Medicare Other | Admitting: Family Medicine

## 2017-03-21 ENCOUNTER — Ambulatory Visit (INDEPENDENT_AMBULATORY_CARE_PROVIDER_SITE_OTHER): Payer: Medicare Other | Admitting: Endocrinology

## 2017-03-21 ENCOUNTER — Encounter: Payer: Self-pay | Admitting: Endocrinology

## 2017-03-21 VITALS — BP 140/82 | HR 97 | Wt 215.2 lb

## 2017-03-21 DIAGNOSIS — E0590 Thyrotoxicosis, unspecified without thyrotoxic crisis or storm: Secondary | ICD-10-CM | POA: Diagnosis not present

## 2017-03-21 LAB — TSH: TSH: 0 u[IU]/mL — ABNORMAL LOW (ref 0.35–4.50)

## 2017-03-21 LAB — T4, FREE: Free T4: 0.72 ng/dL (ref 0.60–1.60)

## 2017-03-21 NOTE — Progress Notes (Signed)
   Subjective:    Patient ID: Misty Cabrera, female    DOB: [birth year], 66 y.o.   MRN: [id number]  HPI Pt returns for f/u of hyperthyroidism (dx'ed mid-2018,  she has never had thyroid imaging; tapazole was chosen as initial rx, due to severity, and need for prompt improvement).  Since on the tapazole, she feels better in general.  Specifically, she says myalgias are much better.  Tapazole was reduced to 10 mg BID, due to eye swelling.   Past Medical History:  Diagnosis Date  . Hyperthyroidism     Past Surgical History:  Procedure Laterality Date  . AUGMENTATION MAMMAPLASTY  1980  . TONSILLECTOMY      Social History   Social History  . Marital status: Divorced    Spouse name: N/A  . Number of children: N/A  . Years of education: N/A   Occupational History  . Not on file.   Social History Main Topics  . Smoking status: Never Smoker  . Smokeless tobacco: Never Used  . Alcohol use No  . Drug use: No  . Sexual activity: Not Currently    Birth control/ protection: None   Other Topics Concern  . Not on file   Social History Narrative  . No narrative on file    Current Outpatient Prescriptions on File Prior to Visit  Medication Sig Dispense Refill  . diphenhydrAMINE (BENADRYL) 25 MG tablet Take 1 tablet (25 mg total) by mouth every 6 (six) hours as needed. 30 tablet 0  . methimazole (TAPAZOLE) 10 MG tablet Take 1 tablet (10 mg total) by mouth 2 (two) times daily. 120 tablet 5  . metoprolol succinate (TOPROL-XL) 25 MG 24 hr tablet Take 1 tablet (25 mg total) by mouth daily. Take with or immediately following a meal. 30 tablet 0  . naproxen sodium (ALEVE) 220 MG tablet Take 660 mg by mouth 3 (three) times daily as needed (pain/headache).     No current facility-administered medications on file prior to visit.     No Known Allergies  Family History  Problem Relation Age of Onset  . Diabetes Maternal Aunt   . Diabetes Maternal Uncle   . Goiter Mother     BP 140/82    Pulse 97   Wt 215 lb 3.2 oz (97.6 kg)   SpO2 98%   BMI 35.81 kg/m   Review of Systems Denies fever.  She has lost a few lbs.      Objective:   Physical Exam VITAL SIGNS:  See vs page GENERAL: no distress NECK: thyroid is 2-3 times normal, (R>L), with multinodular surface.   Lab Results  Component Value Date   TSH 0.00 Repeated and verified X2. (L) 03/21/2017      Assessment & Plan:  Hyperthyroidism, not better yet. Please continue the same medication  Patient Instructions  blood tests are requested for you today.  We'll let you know about the results.  If ever you have fever while taking methimazole, stop it and call us, even if the reason is obvious, because of the risk of a rare side-effect. Please come back for a follow-up appointment in 2 months.

## 2017-03-21 NOTE — Patient Instructions (Addendum)
blood tests are requested for you today.  We'll let you know about the results.   If ever you have fever while taking methimazole, stop it and call us, even if the reason is obvious, because of the risk of a rare side-effect.  Please come back for a follow-up appointment in 2 months.   

## 2017-04-05 ENCOUNTER — Other Ambulatory Visit: Payer: Self-pay

## 2017-04-05 ENCOUNTER — Telehealth: Payer: Self-pay | Admitting: Endocrinology

## 2017-04-05 MED ORDER — METOPROLOL SUCCINATE ER 25 MG PO TB24: 25.0000 mg | ORAL_TABLET | Freq: Every day | ORAL | 5 refills | Status: DC

## 2017-04-05 NOTE — Telephone Encounter (Signed)
Done

## 2017-04-05 NOTE — Telephone Encounter (Signed)
Patient out of meds need a new prescription for metoprolol and refills. [facility]

## 2017-04-19 ENCOUNTER — Other Ambulatory Visit (INDEPENDENT_AMBULATORY_CARE_PROVIDER_SITE_OTHER): Payer: Medicare Other

## 2017-04-19 ENCOUNTER — Other Ambulatory Visit: Payer: Self-pay | Admitting: Endocrinology

## 2017-04-19 DIAGNOSIS — E0590 Thyrotoxicosis, unspecified without thyrotoxic crisis or storm: Secondary | ICD-10-CM

## 2017-04-19 LAB — T4, FREE: FREE T4: 0.37 ng/dL — AB (ref 0.60–1.60)

## 2017-04-19 LAB — TSH: TSH: 6.47 u[IU]/mL — AB (ref 0.35–4.50)

## 2017-04-19 MED ORDER — METHIMAZOLE 10 MG PO TABS: 10.0000 mg | ORAL_TABLET | Freq: Every day | ORAL | 2 refills | Status: DC

## 2017-04-20 ENCOUNTER — Other Ambulatory Visit: Payer: Self-pay

## 2017-05-23 ENCOUNTER — Ambulatory Visit: Payer: Medicare Other | Admitting: Endocrinology

## 2017-06-26 ENCOUNTER — Ambulatory Visit (INDEPENDENT_AMBULATORY_CARE_PROVIDER_SITE_OTHER): Payer: 59 | Admitting: Endocrinology

## 2017-06-26 ENCOUNTER — Encounter: Payer: Self-pay | Admitting: Endocrinology

## 2017-06-26 VITALS — BP 146/84 | HR 87 | Wt 232.8 lb

## 2017-06-26 DIAGNOSIS — E0590 Thyrotoxicosis, unspecified without thyrotoxic crisis or storm: Secondary | ICD-10-CM

## 2017-06-26 LAB — TSH: TSH: 0.61 u[IU]/mL (ref 0.35–4.50)

## 2017-06-26 LAB — T4, FREE: FREE T4: 0.95 ng/dL (ref 0.60–1.60)

## 2017-06-26 MED ORDER — METHIMAZOLE 10 MG PO TABS: 10.0000 mg | ORAL_TABLET | Freq: Every day | ORAL | 2 refills | Status: DC

## 2017-06-26 NOTE — Progress Notes (Signed)
Subjective:    Patient ID: Misty Cabrera, female    DOB: [birth year], 67 y.o.   MRN: [id number]  HPI Pt returns for f/u of hyperthyroidism (dx'ed mid-2018,  she has never had thyroid imaging; tapazole was chosen as initial rx, due to severity, and need for prompt improvement).  She has myalgias and weight gain.  Past Medical History:  Diagnosis Date  . Hyperthyroidism     Past Surgical History:  Procedure Laterality Date  . AUGMENTATION MAMMAPLASTY  1980  . TONSILLECTOMY      Social History   Socioeconomic History  . Marital status: Divorced    Spouse name: Not on file  . Number of children: Not on file  . Years of education: Not on file  . Highest education level: Not on file  Social Needs  . Financial resource strain: Not on file  . Food insecurity - worry: Not on file  . Food insecurity - inability: Not on file  . Transportation needs - medical: Not on file  . Transportation needs - non-medical: Not on file  Occupational History  . Not on file  Tobacco Use  . Smoking status: Never Smoker  . Smokeless tobacco: Never Used  Substance and Sexual Activity  . Alcohol use: No  . Drug use: No  . Sexual activity: Not Currently    Birth control/protection: None  Other Topics Concern  . Not on file  Social History Narrative  . Not on file    Current Outpatient Medications on File Prior to Visit  Medication Sig Dispense Refill  . diphenhydrAMINE (BENADRYL) 25 MG tablet Take 1 tablet (25 mg total) by mouth every 6 (six) hours as needed. 30 tablet 0  . naproxen sodium (ALEVE) 220 MG tablet Take 660 mg by mouth 3 (three) times daily as needed (pain/headache).    . metoprolol succinate (TOPROL-XL) 25 MG 24 hr tablet Take 1 tablet (25 mg total) by mouth daily. Take with or immediately following a meal. 30 tablet 5   No current facility-administered medications on file prior to visit.     No Known Allergies  Family History  Problem Relation Age of Onset  . Diabetes  Maternal Aunt   . Diabetes Maternal Uncle   . Goiter Mother     BP (!) 146/84 (BP Location: Right Wrist, Patient Position: Sitting, Cuff Size: Normal)   Pulse 87   Wt 232 lb 12.8 oz (105.6 kg)   SpO2 97%   BMI 38.74 kg/m    Review of Systems Denies fever.     Objective:   Physical Exam VITAL SIGNS:  See vs page GENERAL: no distress NECK: thyroid is 2-3 times normal, (R>L), with multinodular surface.       Assessment & Plan:  Hyperthyroidism, due for recheck.  We discussed options.  She declines RAI, at least for now.   Patient Instructions  blood tests are requested for you today.  We'll let you know about the results.  If ever you have fever while taking methimazole, stop it and call us, even if the reason is obvious, because of the risk of a rare side-effect.  Please let us know if you want to take the radioactive iodine pill.  It works like this:  let's check a thyroid "scan" (a special, but easy and painless type of thyroid x ray).  you go to the x-ray department of the hospital to swallow a pill, which contains a miniscule amount of radiation.  You will not notice any symptoms  from this.  You will go back to the x-ray department the next day, to lie down in front of a camera.  The results of this will be sent to me.   Based on the results, i hope to order for you a treatment pill of radioactive iodine.  Although it is a larger amount of radiation, you will again notice no symptoms from this.  The pill is gone from your body in a few days (during which you should stay away from other people), but takes several months to work.  Therefore, please return here approximately 6-8 weeks after the treatment.  This treatment has been available for many years, and the only known side-effect is an underactive thyroid.  It is possible that i would eventually prescribe for you a thyroid hormone pill, which is very inexpensive.  You don't have to worry about side-effects of this thyroid hormone  pill, because it is the same molecule your thyroid makes.  Please also see a new PCP, as your blood pressure is high.  I hesitate to stop the metoprolol, despite the fact that you no longer need it for your thyroid.  Please come back for a follow-up appointment in 2 months.

## 2017-06-26 NOTE — Patient Instructions (Addendum)
blood tests are requested for you today.  We'll let you know about the results.  If ever you have fever while taking methimazole, stop it and call us, even if the reason is obvious, because of the risk of a rare side-effect.  Please let us know if you want to take the radioactive iodine pill.  It works like this:  let's check a thyroid "scan" (a special, but easy and painless type of thyroid x ray).  you go to the x-ray department of the hospital to swallow a pill, which contains a miniscule amount of radiation.  You will not notice any symptoms from this.  You will go back to the x-ray department the next day, to lie down in front of a camera.  The results of this will be sent to me.   Based on the results, i hope to order for you a treatment pill of radioactive iodine.  Although it is a larger amount of radiation, you will again notice no symptoms from this.  The pill is gone from your body in a few days (during which you should stay away from other people), but takes several months to work.  Therefore, please return here approximately 6-8 weeks after the treatment.  This treatment has been available for many years, and the only known side-effect is an underactive thyroid.  It is possible that i would eventually prescribe for you a thyroid hormone pill, which is very inexpensive.  You don't have to worry about side-effects of this thyroid hormone pill, because it is the same molecule your thyroid makes.  Please also see a new PCP, as your blood pressure is high.  I hesitate to stop the metoprolol, despite the fact that you no longer need it for your thyroid.  Please come back for a follow-up appointment in 2 months.

## 2017-07-16 DIAGNOSIS — J069 Acute upper respiratory infection, unspecified: Secondary | ICD-10-CM | POA: Diagnosis not present

## 2017-07-25 ENCOUNTER — Ambulatory Visit (INDEPENDENT_AMBULATORY_CARE_PROVIDER_SITE_OTHER): Payer: Medicare HMO | Admitting: Family Medicine

## 2017-07-25 ENCOUNTER — Other Ambulatory Visit: Payer: Self-pay

## 2017-07-25 ENCOUNTER — Encounter: Payer: Self-pay | Admitting: Family Medicine

## 2017-07-25 VITALS — BP 140/90 | HR 95 | Temp 98.0°F | Ht 66.0 in | Wt 227.0 lb

## 2017-07-25 DIAGNOSIS — Z23 Encounter for immunization: Secondary | ICD-10-CM

## 2017-07-25 DIAGNOSIS — Z6836 Body mass index (BMI) 36.0-36.9, adult: Secondary | ICD-10-CM

## 2017-07-25 DIAGNOSIS — I10 Essential (primary) hypertension: Secondary | ICD-10-CM | POA: Diagnosis not present

## 2017-07-25 NOTE — Patient Instructions (Addendum)
1. Continue with metoprolol 2. Calorie goal of 1400 calories a day, will give about 1 lb weight loss a week 3. Gym at least 3 times a week, start slow, walking treadmill, build upto 45 minutes exercise sessions    IF you received an x-ray today, you will receive an invoice from [facility]. Please contact [facility] at [phone] with questions or concerns regarding your invoice.   IF you received labwork today, you will receive an invoice from [facility]. Please contact [facility] at [phone] with questions or concerns regarding your invoice.   Our billing staff will not be able to assist you with questions regarding bills from these companies.  You will be contacted with the lab results as soon as they are available. The fastest way to get your results is to activate your My Chart account. Instructions are located on the last page of this paperwork. If you have not heard from [place] regarding the results in 2 weeks, please contact this office.

## 2017-07-25 NOTE — Progress Notes (Signed)
2/12/20198:13 AM  Misty Cabrera 03-26-1951, 67 y.o. female [id number]  Chief Complaint  Patient presents with  . Follow-up    BLOOD PRESSURE CHK    HPI:   Patient is a 67 y.o. female with past medical history significant for hyperthryodisim who presents today for follow-up. Originally BP was high, though to be related to hyperthyroidism. However now having continued elevated BP despite normalization of thyroid function. She continues to take metoprolol, tolerates well. She reports significant weight gain, slowly thru the years, she has a very sedentary job and has changed her dietary habits.   TSH and FT4 normal 06/2017  Depression screen [facility] 2/9 07/25/2017 02/23/2017 12/31/2016  Decreased Interest 0 0 0  Down, Depressed, Hopeless 0 0 0  PHQ - 2 Score 0 0 0    No Known Allergies  Prior to Admission medications   Medication Sig Start Date End Date Taking? Authorizing Provider  diphenhydrAMINE (BENADRYL) 25 MG tablet Take 1 tablet (25 mg total) by mouth every 6 (six) hours as needed. 02/23/17  Yes Rutherford Guys, MD  methimazole (TAPAZOLE) 10 MG tablet Take 1 tablet (10 mg total) by mouth daily. 06/26/17  Yes Renato Shin, MD  naproxen sodium (ALEVE) 220 MG tablet Take 660 mg by mouth 3 (three) times daily as needed (pain/headache).   Yes [provider]  metoprolol succinate (TOPROL-XL) 25 MG 24 hr tablet Take 1 tablet (25 mg total) by mouth daily. Take with or immediately following a meal. 04/05/17 05/05/17  Renato Shin, MD    Past Medical History:  Diagnosis Date  . Hyperthyroidism     Past Surgical History:  Procedure Laterality Date  . AUGMENTATION MAMMAPLASTY  1980  . TONSILLECTOMY      Social History   Tobacco Use  . Smoking status: Never Smoker  . Smokeless tobacco: Never Used  Substance Use Topics  . Alcohol use: No    Family History  Problem Relation Age of Onset  . Diabetes Maternal Aunt   . Diabetes Maternal Uncle   . Goiter Mother      ROS Per hpi  OBJECTIVE:  Blood pressure 140/90, pulse 95, temperature 98 F (36.7 C), temperature source Oral, height 5\' 6"  (1.676 m), weight 227 lb (103 kg), SpO2 100 %.  BP Readings from Last 3 Encounters:  07/25/17 140/90  06/26/17 (!) 146/84  03/21/17 140/82    Physical Exam  Constitutional: She is oriented to person, place, and time and well-developed, well-nourished, and in no distress.  HENT:  Head: Normocephalic and atraumatic.  Mouth/Throat: Mucous membranes are normal.  Eyes: EOM are normal. Pupils are equal, round, and reactive to light. No scleral icterus.  Neck: Neck supple.  Pulmonary/Chest: Effort normal.  Neurological: She is alert and oriented to person, place, and time. Gait normal.  Skin: Skin is warm and dry.  Psychiatric: Mood and affect normal.  Nursing note and vitals reviewed.    ASSESSMENT and PLAN  1. Essential hypertension, benign 2. BMI 36.0-36.9,adult  Over 50% of this 25 minute visit was spent discussing weight loss, using MI techniques to explore thoughts, strategies, motivation for weight loss. Provided educational handout on 1400 calorie block diet. This should accomplish 1 lb weight loss a week. Explored realistic goals regarding exercise, how to incorporate into daily routine and how to build endurance. In the meanwhile will continue with metoprolol as BP 140/90 currently on it. Goal would be to reach healthier weight and achieve normalization of BP.   3. Need for  prophylactic vaccination and inoculation against influenza - Flu Vaccine QUAD 36+ mos IM  Return in about 2 months (around 09/22/2017).    Rutherford Guys, MD Primary Care at [place], [place] [address] Ph.  [phone] Fax [phone]

## 2017-08-01 ENCOUNTER — Telehealth: Payer: Self-pay | Admitting: Family Medicine

## 2017-08-01 DIAGNOSIS — R69 Illness, unspecified: Secondary | ICD-10-CM | POA: Diagnosis not present

## 2017-08-01 NOTE — Telephone Encounter (Signed)
Copied from [facility] #[id number]. Topic: Quick Communication - Rx Refill/Question >> Aug 01, 2017  3:37 PM Oliver Pila B wrote: Medication: metoprolol succinate (TOPROL-XL) 25 MG 24 hr tablet [[id number]] ENDED, contact pt to advise  (Agent: If no, request that the patient contact the pharmacy for the refill.)   Preferred Pharmacy (with phone number or street name): [facility] [address]   Agent: Please be advised that RX refills may take up to 3 business days. We ask that you follow-up with your pharmacy.

## 2017-08-06 ENCOUNTER — Other Ambulatory Visit: Payer: Self-pay

## 2017-08-06 MED ORDER — METOPROLOL SUCCINATE ER 25 MG PO TB24: 25.0000 mg | ORAL_TABLET | Freq: Every day | ORAL | 2 refills | Status: DC

## 2017-08-06 NOTE — Telephone Encounter (Signed)
Per last OV note, continue Metoprolol. Sent#30 with 2 refills to [place]

## 2017-08-09 IMAGING — DX DG CHEST 2V
2 series · 2 of 2 positions shown · non-contrast
Comparison: None.

CLINICAL DATA: Irregular heartbeat, bilateral ankle swelling

EXAM:
CHEST  2 VIEW

[w chest pa]
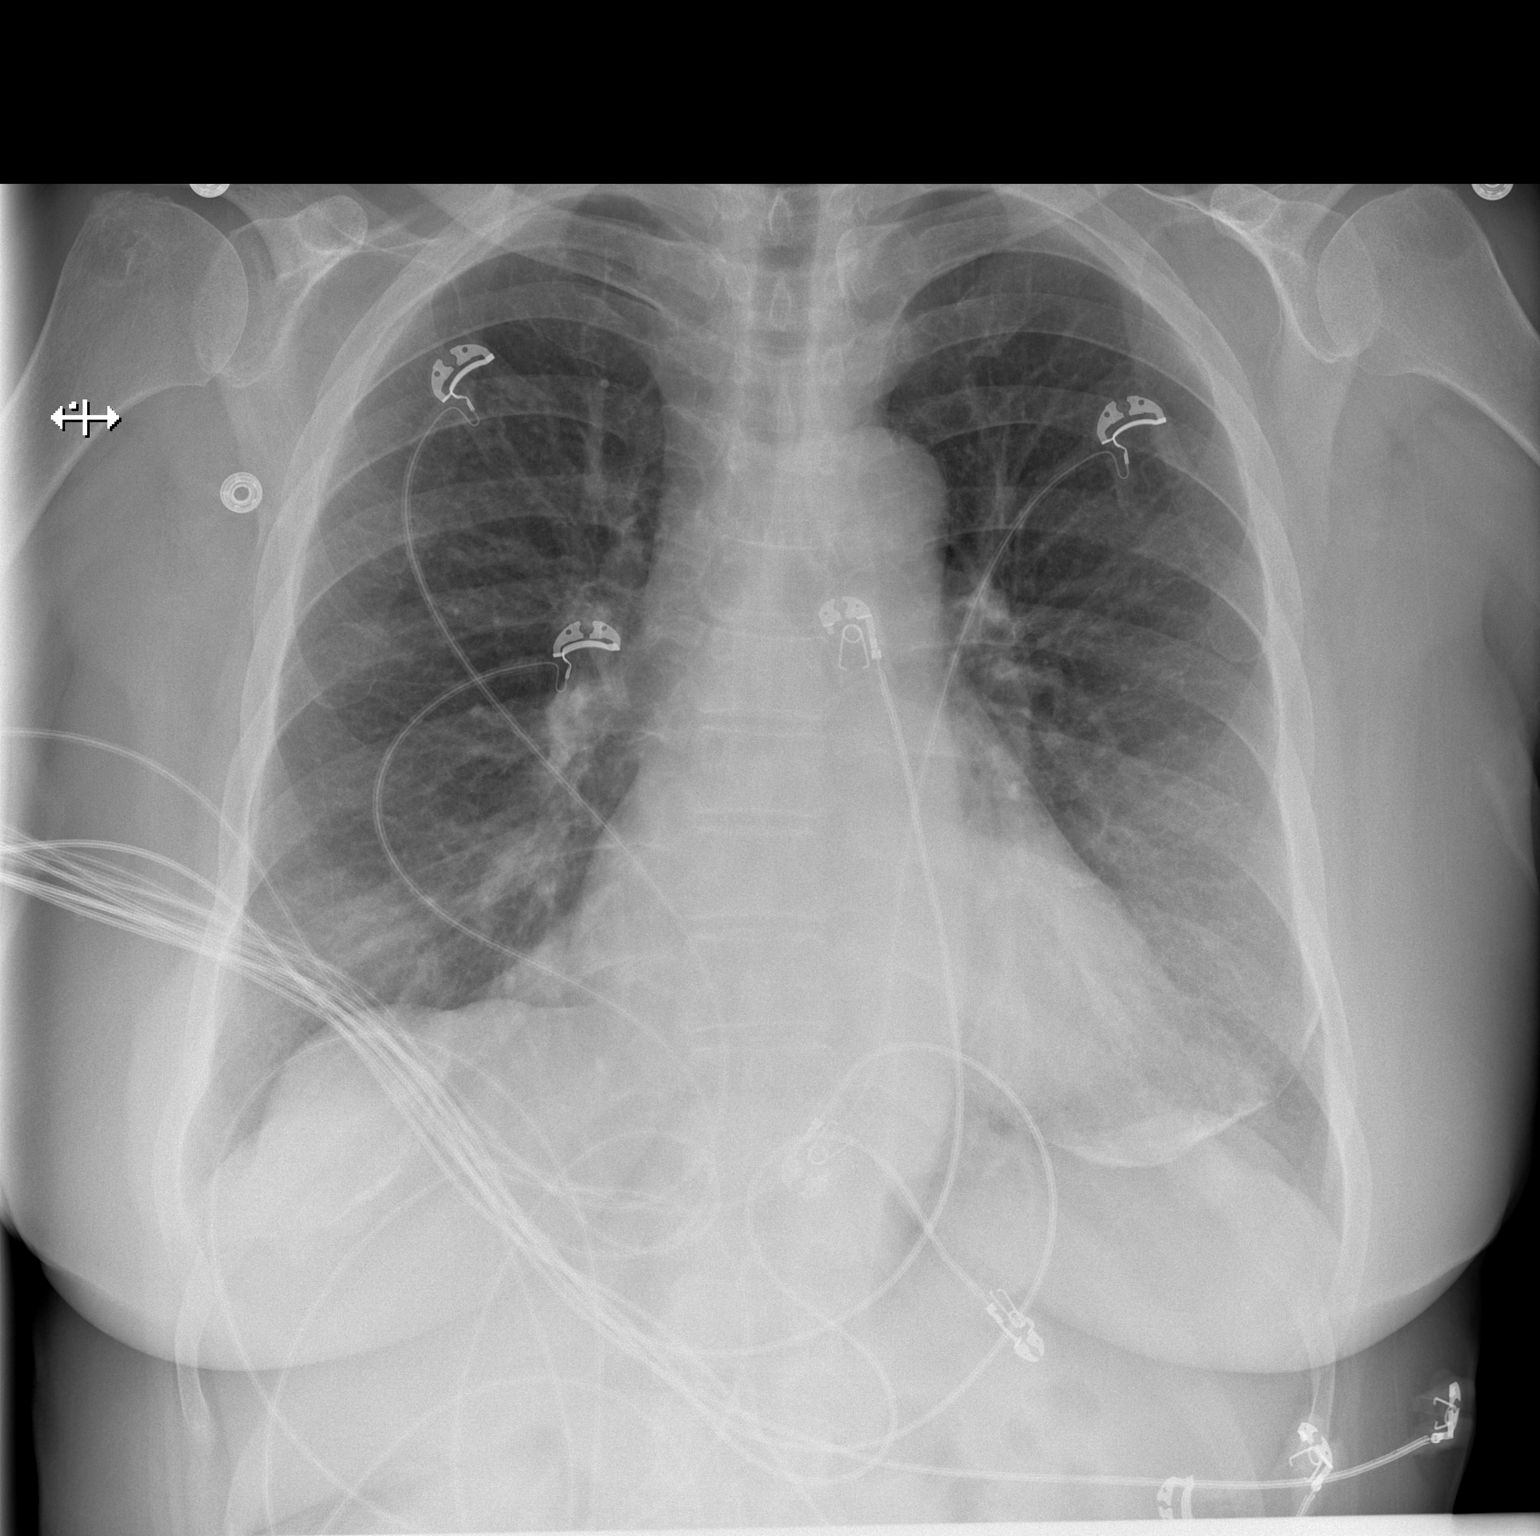

[w chest lat]
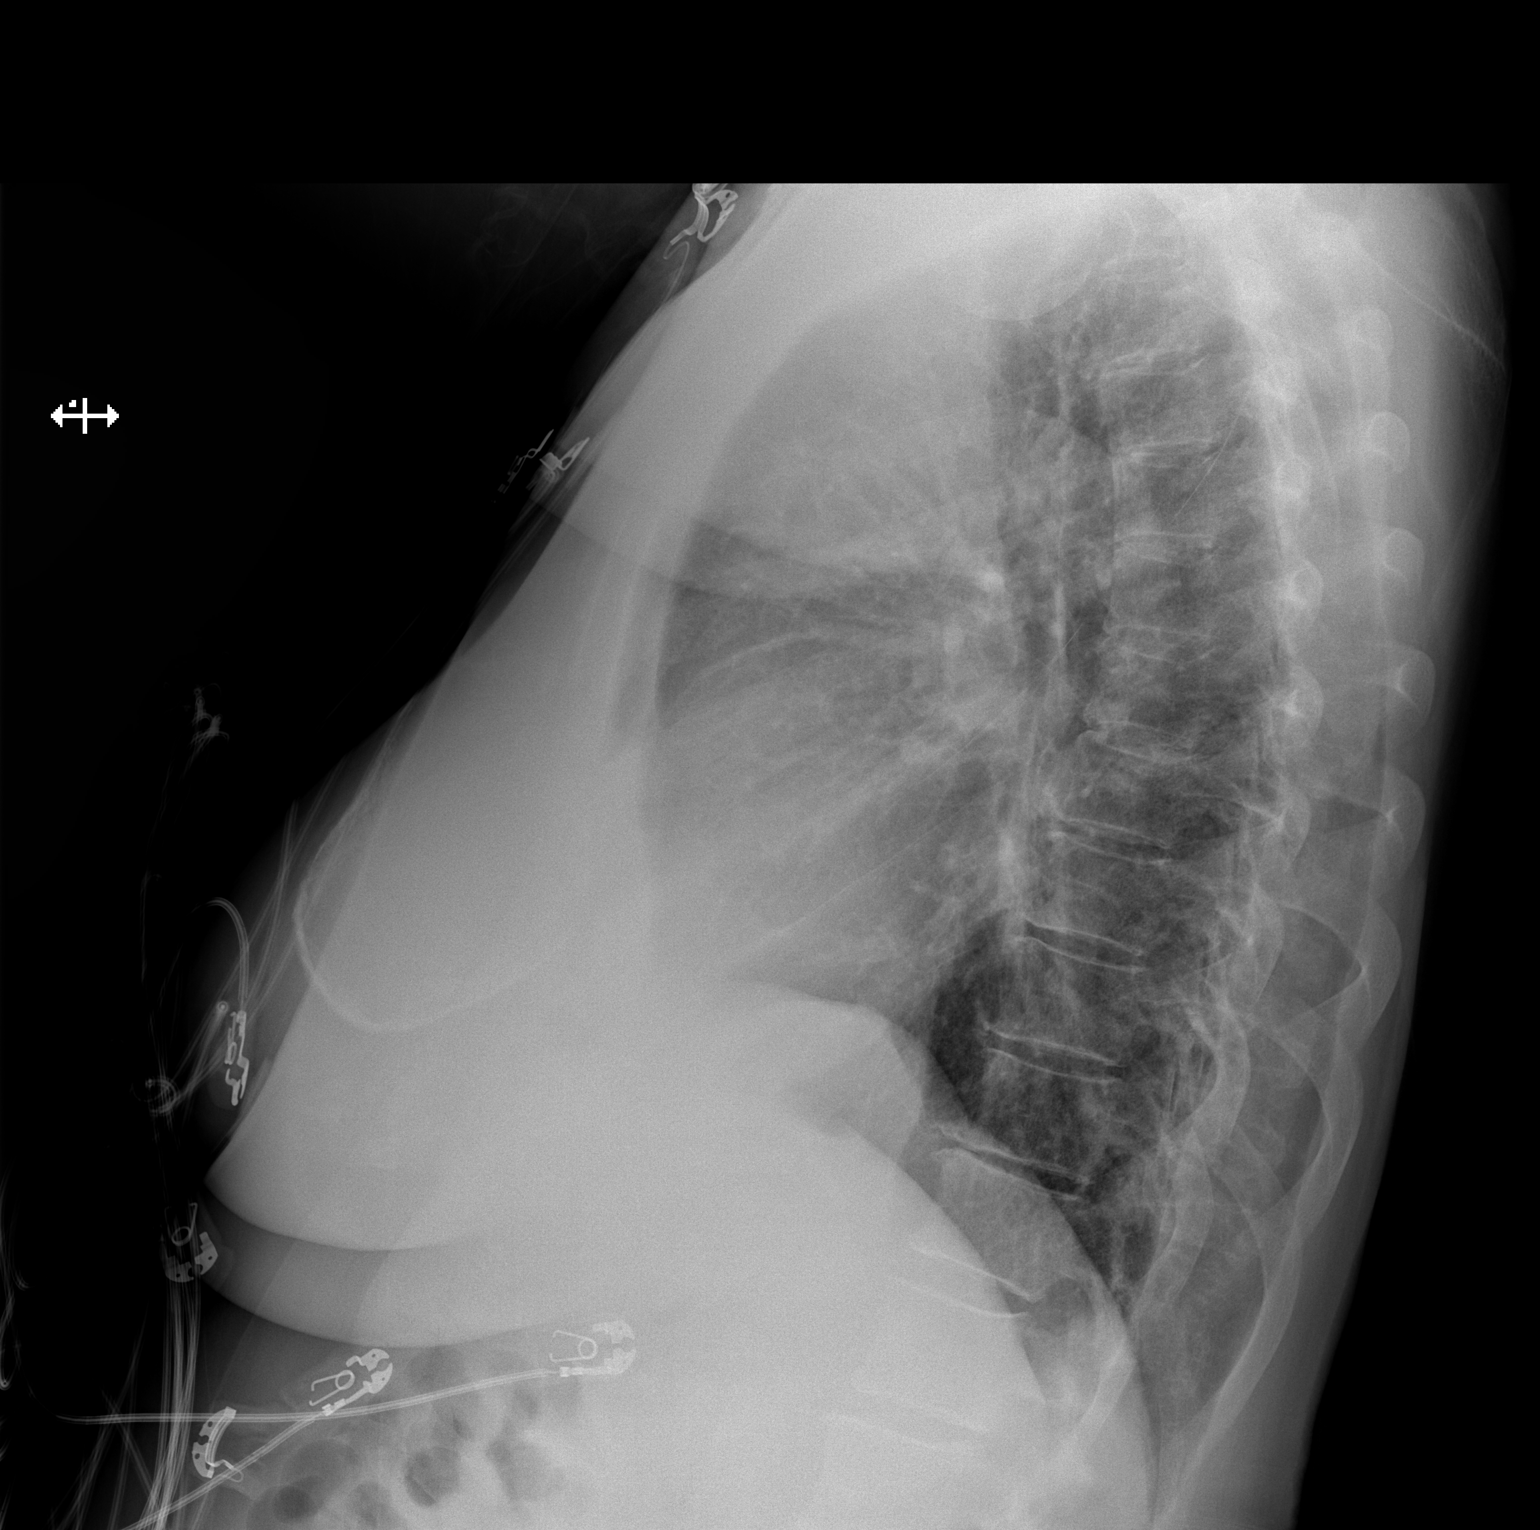

[2 of 2 positions shown; findings below may reference images not displayed]

FINDINGS: Lungs are clear.  No pleural effusion or pneumothorax.

The heart is normal in size.

Bilateral breast augmentation.

Visualized osseous structures are within normal limits.
IMPRESSION: No evidence of acute cardiopulmonary disease.

## 2017-08-24 ENCOUNTER — Ambulatory Visit (INDEPENDENT_AMBULATORY_CARE_PROVIDER_SITE_OTHER): Payer: 59 | Admitting: Endocrinology

## 2017-08-24 ENCOUNTER — Encounter: Payer: Self-pay | Admitting: Endocrinology

## 2017-08-24 VITALS — BP 152/98 | HR 97 | Wt 233.4 lb

## 2017-08-24 DIAGNOSIS — E0590 Thyrotoxicosis, unspecified without thyrotoxic crisis or storm: Secondary | ICD-10-CM | POA: Diagnosis not present

## 2017-08-24 LAB — T4, FREE: FREE T4: 0.69 ng/dL (ref 0.60–1.60)

## 2017-08-24 LAB — TSH: TSH: 0.59 u[IU]/mL (ref 0.35–4.50)

## 2017-08-24 NOTE — Patient Instructions (Addendum)
Your blood pressure is high today.  Please see your primary care provider soon, to have it rechecked blood tests are requested for you today.  We'll let you know about the results. Please come back for a follow-up appointment in 3 months.

## 2017-08-24 NOTE — Progress Notes (Signed)
   Subjective:    Patient ID: Misty Cabrera, female    DOB: 10-11-1950, 67 y.o.   MRN: [id number]  HPI Pt returns for f/u of hyperthyroidism (dx'ed mid-2018; she has never had dedicated thyroid imaging, but chest CT made no mention of the thyroid; PE suggests nodular goiter; she declines RAI).  She has fatigue and weight gain.   Past Medical History:  Diagnosis Date  . Hyperthyroidism     Past Surgical History:  Procedure Laterality Date  . AUGMENTATION MAMMAPLASTY  1980  . TONSILLECTOMY      Social History   Socioeconomic History  . Marital status: Divorced    Spouse name: Not on file  . Number of children: Not on file  . Years of education: Not on file  . Highest education level: Not on file  Social Needs  . Financial resource strain: Not on file  . Food insecurity - worry: Not on file  . Food insecurity - inability: Not on file  . Transportation needs - medical: Not on file  . Transportation needs - non-medical: Not on file  Occupational History  . Not on file  Tobacco Use  . Smoking status: Never Smoker  . Smokeless tobacco: Never Used  Substance and Sexual Activity  . Alcohol use: No  . Drug use: No  . Sexual activity: Not Currently    Birth control/protection: None  Other Topics Concern  . Not on file  Social History Narrative  . Not on file    Current Outpatient Medications on File Prior to Visit  Medication Sig Dispense Refill  . diphenhydrAMINE (BENADRYL) 25 MG tablet Take 1 tablet (25 mg total) by mouth every 6 (six) hours as needed. 30 tablet 0  . methimazole (TAPAZOLE) 10 MG tablet Take 1 tablet (10 mg total) by mouth daily. 30 tablet 2  . metoprolol succinate (TOPROL-XL) 25 MG 24 hr tablet Take 1 tablet (25 mg total) by mouth daily. Take with or immediately following a meal. 30 tablet 2  . naproxen sodium (ALEVE) 220 MG tablet Take 660 mg by mouth 3 (three) times daily as needed (pain/headache).     No current facility-administered medications on file  prior to visit.     No Known Allergies  Family History  Problem Relation Age of Onset  . Diabetes Maternal Aunt   . Diabetes Maternal Uncle   . Goiter Mother     BP (!) 152/98 (BP Location: Right Arm, Patient Position: Sitting, Cuff Size: Large)   Pulse 97   Wt 233 lb 6.4 oz (105.9 kg)   SpO2 96%   BMI 37.67 kg/m    Review of Systems Denies fever.      Objective:   Physical Exam VITAL SIGNS:  See vs page GENERAL: no distress NECK: thyroid is slightly enlarged, (R>L), with ? of right nodule.     Assessment & Plan:  Hyperthyroidism: due for recheck.  Patient Instructions  Your blood pressure is high today.  Please see your primary care provider soon, to have it rechecked blood tests are requested for you today.  We'll let you know about the results. Please come back for a follow-up appointment in 3 months.

## 2017-09-12 ENCOUNTER — Encounter: Payer: Self-pay | Admitting: Physician Assistant

## 2017-12-05 ENCOUNTER — Encounter: Payer: Self-pay | Admitting: Endocrinology

## 2017-12-05 ENCOUNTER — Ambulatory Visit (INDEPENDENT_AMBULATORY_CARE_PROVIDER_SITE_OTHER): Payer: 59 | Admitting: Endocrinology

## 2017-12-05 VITALS — BP 128/88 | HR 92 | Ht 66.0 in | Wt 236.2 lb

## 2017-12-05 DIAGNOSIS — E0590 Thyrotoxicosis, unspecified without thyrotoxic crisis or storm: Secondary | ICD-10-CM

## 2017-12-05 LAB — T4, FREE: Free T4: 0.71 ng/dL (ref 0.60–1.60)

## 2017-12-05 LAB — TSH: TSH: 14.59 u[IU]/mL — ABNORMAL HIGH (ref 0.35–4.50)

## 2017-12-05 MED ORDER — LOSARTAN POTASSIUM-HCTZ 50-12.5 MG PO TABS: 0.5000 | ORAL_TABLET | Freq: Every day | ORAL | 5 refills | Status: DC

## 2017-12-05 MED ORDER — METOPROLOL SUCCINATE ER 25 MG PO TB24: 12.5000 mg | ORAL_TABLET | Freq: Every day | ORAL | 2 refills | Status: DC

## 2017-12-05 MED ORDER — METHIMAZOLE 5 MG PO TABS: 5.0000 mg | ORAL_TABLET | Freq: Three times a day (TID) | ORAL | 1 refills | Status: DC

## 2017-12-05 NOTE — Patient Instructions (Addendum)
blood tests are requested for you today.  We'll let you know about the results. If ever you have fever while taking methimazole, stop it and call us, even if the reason is obvious, because of the risk of a rare side-effect.  I have sent 2 prescriptions to your pharmacy: to reduce the metoprolol, and to add "losartan-HCTZ."   Please come back for a follow-up appointment in 3 months.

## 2017-12-05 NOTE — Progress Notes (Signed)
Subjective:    Patient ID: Misty Cabrera, female    DOB: [birth year], 67 y.o.   MRN: [id number]  HPI Pt returns for f/u of hyperthyroidism (dx'ed mid-2018; she has never had dedicated thyroid imaging, but chest CT made no mention of the thyroid; PE suggests nodular goiter; she declines RAI).  She has moderate hair loss throughout the head, and assoc fatigue.    Past Medical History:  Diagnosis Date  . Hyperthyroidism     Past Surgical History:  Procedure Laterality Date  . AUGMENTATION MAMMAPLASTY  1980  . TONSILLECTOMY      Social History   Socioeconomic History  . Marital status: Divorced    Spouse name: Not on file  . Number of children: Not on file  . Years of education: Not on file  . Highest education level: Not on file  Occupational History  . Not on file  Social Needs  . Financial resource strain: Not on file  . Food insecurity:    Worry: Not on file    Inability: Not on file  . Transportation needs:    Medical: Not on file    Non-medical: Not on file  Tobacco Use  . Smoking status: Never Smoker  . Smokeless tobacco: Never Used  Substance and Sexual Activity  . Alcohol use: No  . Drug use: No  . Sexual activity: Not Currently    Birth control/protection: None  Lifestyle  . Physical activity:    Days per week: Not on file    Minutes per session: Not on file  . Stress: Not on file  Relationships  . Social connections:    Talks on phone: Not on file    Gets together: Not on file    Attends religious service: Not on file    Active member of club or organization: Not on file    Attends meetings of clubs or organizations: Not on file    Relationship status: Not on file  . Intimate partner violence:    Fear of current or ex partner: Not on file    Emotionally abused: Not on file    Physically abused: Not on file    Forced sexual activity: Not on file  Other Topics Concern  . Not on file  Social History Narrative  . Not on file    Current Outpatient  Medications on File Prior to Visit  Medication Sig Dispense Refill  . diphenhydrAMINE (BENADRYL) 25 MG tablet Take 1 tablet (25 mg total) by mouth every 6 (six) hours as needed. 30 tablet 0  . naproxen sodium (ALEVE) 220 MG tablet Take 660 mg by mouth 3 (three) times daily as needed (pain/headache).     No current facility-administered medications on file prior to visit.     No Known Allergies  Family History  Problem Relation Age of Onset  . Diabetes Maternal Aunt   . Diabetes Maternal Uncle   . Goiter Mother     BP 128/88 (BP Location: Right Arm, Patient Position: Sitting, Cuff Size: Normal)   Pulse 92   Ht 5\' 6"  (1.676 m)   Wt 236 lb 3.2 oz (107.1 kg)   SpO2 95%   BMI 38.12 kg/m   Review of Systems Denies fever and sob.      Objective:   Physical Exam VITAL SIGNS:  See vs page GENERAL: no distress NECK: thyroid is slightly and diffusely enlarged   Lab Results  Component Value Date   CREATININE 0.63 12/31/2016   BUN 11  12/31/2016   NA 140 12/31/2016   K 3.8 12/31/2016   CL 108 12/31/2016   CO2 23 12/31/2016       Assessment & Plan:  Hyperthyroidism: recheck today.   Hair loss: not thyroid-related.  Fatigue: poss due to metoprolol.  I told pt that PCP should address this.  She requests that I adjust meds, pending f/u with PCP.   HTN: well-controlled   Patient Instructions  blood tests are requested for you today.  We'll let you know about the results. If ever you have fever while taking methimazole, stop it and call us, even if the reason is obvious, because of the risk of a rare side-effect.  I have sent 2 prescriptions to your pharmacy: to reduce the metoprolol, and to add "losartan-HCTZ."   Please come back for a follow-up appointment in 3 months.

## 2017-12-06 ENCOUNTER — Other Ambulatory Visit: Payer: Self-pay | Admitting: Endocrinology

## 2017-12-06 ENCOUNTER — Telehealth: Payer: Self-pay | Admitting: Endocrinology

## 2017-12-06 MED ORDER — METHIMAZOLE 5 MG PO TABS: 5.0000 mg | ORAL_TABLET | Freq: Every day | ORAL | 1 refills | Status: DC

## 2017-12-06 NOTE — Telephone Encounter (Signed)
I've called patient & sent Dr. Loanne Drilling a note regarding her labs results.

## 2017-12-06 NOTE — Telephone Encounter (Signed)
Patient is clarification on how to take her medication methimazole, please advise

## 2017-12-31 ENCOUNTER — Other Ambulatory Visit: Payer: Self-pay | Admitting: Endocrinology

## 2018-01-25 ENCOUNTER — Other Ambulatory Visit: Payer: Self-pay | Admitting: Family Medicine

## 2018-01-25 NOTE — Telephone Encounter (Signed)
Metoprolol refill Last Refill:12/05/17 # 30/2 refill by another provider Last OV: 07/25/17  PCP: [place]: [id number] - [address], [place] (Phone) [phone] (Fax)

## 2018-02-06 DIAGNOSIS — R69 Illness, unspecified: Secondary | ICD-10-CM | POA: Diagnosis not present

## 2018-02-18 ENCOUNTER — Other Ambulatory Visit: Payer: Self-pay | Admitting: Endocrinology

## 2018-03-08 ENCOUNTER — Ambulatory Visit (INDEPENDENT_AMBULATORY_CARE_PROVIDER_SITE_OTHER): Payer: 59 | Admitting: Endocrinology

## 2018-03-08 ENCOUNTER — Encounter: Payer: Self-pay | Admitting: Endocrinology

## 2018-03-08 VITALS — BP 136/98 | HR 100 | Ht 66.0 in | Wt 233.4 lb

## 2018-03-08 DIAGNOSIS — E0590 Thyrotoxicosis, unspecified without thyrotoxic crisis or storm: Secondary | ICD-10-CM

## 2018-03-08 LAB — TSH: TSH: 5.02 u[IU]/mL — ABNORMAL HIGH (ref 0.35–4.50)

## 2018-03-08 LAB — T4, FREE: FREE T4: 0.95 ng/dL (ref 0.60–1.60)

## 2018-03-08 MED ORDER — METHIMAZOLE 5 MG PO TABS: 2.5000 mg | ORAL_TABLET | Freq: Every day | ORAL | 1 refills | Status: DC

## 2018-03-08 MED ORDER — LOSARTAN POTASSIUM-HCTZ 50-12.5 MG PO TABS: 1.0000 | ORAL_TABLET | Freq: Every day | ORAL | 5 refills | Status: DC

## 2018-03-08 NOTE — Progress Notes (Signed)
Subjective:    Patient ID: Misty Cabrera, female    DOB: [birth year], 67 y.o.   MRN: [id number]  HPI Pt returns for f/u of hyperthyroidism (dx'ed mid-2018; she has never had dedicated thyroid imaging, but chest CT made no mention of the thyroid; PE suggests nodular goiter; she declines RAI).  pt states she feels well in general.  Past Medical History:  Diagnosis Date  . Hyperthyroidism     Past Surgical History:  Procedure Laterality Date  . AUGMENTATION MAMMAPLASTY  1980  . TONSILLECTOMY      Social History   Socioeconomic History  . Marital status: Divorced    Spouse name: Not on file  . Number of children: Not on file  . Years of education: Not on file  . Highest education level: Not on file  Occupational History  . Not on file  Social Needs  . Financial resource strain: Not on file  . Food insecurity:    Worry: Not on file    Inability: Not on file  . Transportation needs:    Medical: Not on file    Non-medical: Not on file  Tobacco Use  . Smoking status: Never Smoker  . Smokeless tobacco: Never Used  Substance and Sexual Activity  . Alcohol use: No  . Drug use: No  . Sexual activity: Not Currently    Birth control/protection: None  Lifestyle  . Physical activity:    Days per week: Not on file    Minutes per session: Not on file  . Stress: Not on file  Relationships  . Social connections:    Talks on phone: Not on file    Gets together: Not on file    Attends religious service: Not on file    Active member of club or organization: Not on file    Attends meetings of clubs or organizations: Not on file    Relationship status: Not on file  . Intimate partner violence:    Fear of current or ex partner: Not on file    Emotionally abused: Not on file    Physically abused: Not on file    Forced sexual activity: Not on file  Other Topics Concern  . Not on file  Social History Narrative  . Not on file    Current Outpatient Medications on File Prior to  Visit  Medication Sig Dispense Refill  . naproxen sodium (ALEVE) 220 MG tablet Take 660 mg by mouth 3 (three) times daily as needed (pain/headache).     No current facility-administered medications on file prior to visit.     No Known Allergies  Family History  Problem Relation Age of Onset  . Diabetes Maternal Aunt   . Diabetes Maternal Uncle   . Goiter Mother     BP (!) 136/98   Pulse 100   Ht 5\' 6"  (1.676 m)   Wt 233 lb 6.4 oz (105.9 kg)   SpO2 96%   BMI 37.67 kg/m    Review of Systems Denies fever.      Objective:   Physical Exam VITAL SIGNS:  See vs page GENERAL: no distress NECK: There is no palpable thyroid enlargement.  No thyroid nodule is palpable.  No palpable lymphadenopathy at the anterior neck.       Assessment & Plan:  Hyperthyroidism: recheck today Bradycardia: this was prob due to hypothyroidism at last ov.  We discussed resuming the B-blocker.  She declines HTN: she needs increased rx.    Patient Instructions  blood tests are requested for you today.  We'll let you know about the results. If ever you have fever while taking methimazole, stop it and call us, even if the reason is obvious, because of the risk of a rare side-effect.  I have sent a prescription to your pharmacy, to increase the losartan-HCTZ to 1 pill per day.  Please follow up the blood pressure with your primary care provider.   Please come back for a follow-up appointment in 4 months.

## 2018-03-08 NOTE — Patient Instructions (Addendum)
blood tests are requested for you today.  We'll let you know about the results. If ever you have fever while taking methimazole, stop it and call us, even if the reason is obvious, because of the risk of a rare side-effect.  I have sent a prescription to your pharmacy, to increase the losartan-HCTZ to 1 pill per day.  Please follow up the blood pressure with your primary care provider.   Please come back for a follow-up appointment in 4 months.

## 2018-03-09 ENCOUNTER — Telehealth: Payer: Self-pay | Admitting: Endocrinology

## 2018-03-09 NOTE — Telephone Encounter (Signed)
Pt. Is returning a call about lab work. Please advice.

## 2018-03-12 NOTE — Telephone Encounter (Signed)
Spoke to pt and gave clarification that she should be taking half of the 5 mg tab as instructed by Dr. Loanne Drilling

## 2018-05-15 ENCOUNTER — Ambulatory Visit (INDEPENDENT_AMBULATORY_CARE_PROVIDER_SITE_OTHER): Payer: 59 | Admitting: Endocrinology

## 2018-05-15 VITALS — BP 132/88 | HR 103 | Ht 66.0 in | Wt 235.6 lb

## 2018-05-15 DIAGNOSIS — E0590 Thyrotoxicosis, unspecified without thyrotoxic crisis or storm: Secondary | ICD-10-CM | POA: Diagnosis not present

## 2018-05-15 LAB — TSH: TSH: 3.72 u[IU]/mL (ref 0.35–4.50)

## 2018-05-15 LAB — T4, FREE: Free T4: 0.73 ng/dL (ref 0.60–1.60)

## 2018-05-15 NOTE — Patient Instructions (Addendum)
blood tests are requested for you today.  We'll let you know about the results. If ever you have fever while taking methimazole, stop it and call us, even if the reason is obvious, because of the risk of a rare side-effect.  Please come back for a follow-up appointment in 2-3 months.

## 2018-05-15 NOTE — Progress Notes (Signed)
Subjective:    Patient ID: Misty Cabrera, female    DOB: [birth year], 67 y.o.   MRN: [id number]  HPI Pt returns for f/u of hyperthyroidism (dx'ed mid-2018; she has never had dedicated thyroid imaging, but chest CT made no mention of the thyroid; PE suggests nodular goiter; she declines RAI).  pt states she feels well in general, except for cold intolerance.  She takes tapazole as rx'ed Past Medical History:  Diagnosis Date  . Hyperthyroidism     Past Surgical History:  Procedure Laterality Date  . AUGMENTATION MAMMAPLASTY  1980  . TONSILLECTOMY      Social History   Socioeconomic History  . Marital status: Divorced    Spouse name: Not on file  . Number of children: Not on file  . Years of education: Not on file  . Highest education level: Not on file  Occupational History  . Not on file  Social Needs  . Financial resource strain: Not on file  . Food insecurity:    Worry: Not on file    Inability: Not on file  . Transportation needs:    Medical: Not on file    Non-medical: Not on file  Tobacco Use  . Smoking status: Never Smoker  . Smokeless tobacco: Never Used  Substance and Sexual Activity  . Alcohol use: No  . Drug use: No  . Sexual activity: Not Currently    Birth control/protection: None  Lifestyle  . Physical activity:    Days per week: Not on file    Minutes per session: Not on file  . Stress: Not on file  Relationships  . Social connections:    Talks on phone: Not on file    Gets together: Not on file    Attends religious service: Not on file    Active member of club or organization: Not on file    Attends meetings of clubs or organizations: Not on file    Relationship status: Not on file  . Intimate partner violence:    Fear of current or ex partner: Not on file    Emotionally abused: Not on file    Physically abused: Not on file    Forced sexual activity: Not on file  Other Topics Concern  . Not on file  Social History Narrative  . Not on file      Current Outpatient Medications on File Prior to Visit  Medication Sig Dispense Refill  . losartan-hydrochlorothiazide (HYZAAR) 50-12.5 MG tablet Take 1 tablet by mouth daily. 30 tablet 5  . methimazole (TAPAZOLE) 5 MG tablet Take 0.5 tablets (2.5 mg total) by mouth daily. 45 tablet 1  . naproxen sodium (ALEVE) 220 MG tablet Take 660 mg by mouth 3 (three) times daily as needed (pain/headache).     No current facility-administered medications on file prior to visit.     No Known Allergies  Family History  Problem Relation Age of Onset  . Diabetes Maternal Aunt   . Diabetes Maternal Uncle   . Goiter Mother     BP 132/88 (BP Location: Right Arm, Patient Position: Sitting, Cuff Size: Normal)   Pulse (!) 103   Ht 5\' 6"  (1.676 m)   Wt 235 lb 9.6 oz (106.9 kg)   SpO2 95%   BMI 38.03 kg/m    Review of Systems Denies fever    Objective:   Physical Exam VITAL SIGNS:  See vs page GENERAL: no distress NECK: a 2 cm right thyroid nodule is palpable  Lab Results  Component Value Date   TSH 3.72 05/15/2018      Assessment & Plan:  Hyperthyroidism: well-controlled Thyroid nodule: clinically stable.   Patient Instructions  blood tests are requested for you today.  We'll let you know about the results. If ever you have fever while taking methimazole, stop it and call us, even if the reason is obvious, because of the risk of a rare side-effect.  Please come back for a follow-up appointment in 2-3 months.

## 2018-07-09 ENCOUNTER — Ambulatory Visit (INDEPENDENT_AMBULATORY_CARE_PROVIDER_SITE_OTHER): Payer: 59 | Admitting: Endocrinology

## 2018-07-09 ENCOUNTER — Encounter: Payer: Self-pay | Admitting: Endocrinology

## 2018-07-09 VITALS — BP 140/60 | HR 117 | Ht 66.0 in | Wt 239.2 lb

## 2018-07-09 DIAGNOSIS — E0590 Thyrotoxicosis, unspecified without thyrotoxic crisis or storm: Secondary | ICD-10-CM

## 2018-07-09 LAB — TSH: TSH: 3.48 u[IU]/mL (ref 0.35–4.50)

## 2018-07-09 LAB — T4, FREE: FREE T4: 0.94 ng/dL (ref 0.60–1.60)

## 2018-07-09 NOTE — Progress Notes (Signed)
Subjective:    Patient ID: Misty Cabrera, female    DOB: [birth year], 68 y.o.   MRN: [id number]  HPI Pt returns for f/u of hyperthyroidism (dx'ed mid-2018; she has never had dedicated thyroid imaging, but chest CT made no mention of the thyroid; PE suggests nodular goiter; she declines RAI).  pt states she feels well in general, except for fatigue and myalgias.  She takes tapazole as rx'ed.   Past Medical History:  Diagnosis Date  . Hyperthyroidism     Past Surgical History:  Procedure Laterality Date  . AUGMENTATION MAMMAPLASTY  1980  . TONSILLECTOMY      Social History   Socioeconomic History  . Marital status: Divorced    Spouse name: Not on file  . Number of children: Not on file  . Years of education: Not on file  . Highest education level: Not on file  Occupational History  . Not on file  Social Needs  . Financial resource strain: Not on file  . Food insecurity:    Worry: Not on file    Inability: Not on file  . Transportation needs:    Medical: Not on file    Non-medical: Not on file  Tobacco Use  . Smoking status: Never Smoker  . Smokeless tobacco: Never Used  Substance and Sexual Activity  . Alcohol use: No  . Drug use: No  . Sexual activity: Not Currently    Birth control/protection: None  Lifestyle  . Physical activity:    Days per week: Not on file    Minutes per session: Not on file  . Stress: Not on file  Relationships  . Social connections:    Talks on phone: Not on file    Gets together: Not on file    Attends religious service: Not on file    Active member of club or organization: Not on file    Attends meetings of clubs or organizations: Not on file    Relationship status: Not on file  . Intimate partner violence:    Fear of current or ex partner: Not on file    Emotionally abused: Not on file    Physically abused: Not on file    Forced sexual activity: Not on file  Other Topics Concern  . Not on file  Social History Narrative  . Not  on file    Current Outpatient Medications on File Prior to Visit  Medication Sig Dispense Refill  . losartan-hydrochlorothiazide (HYZAAR) 50-12.5 MG tablet Take 1 tablet by mouth daily. 30 tablet 5  . methimazole (TAPAZOLE) 5 MG tablet Take 0.5 tablets (2.5 mg total) by mouth daily. 45 tablet 1   No current facility-administered medications on file prior to visit.     No Known Allergies  Family History  Problem Relation Age of Onset  . Diabetes Maternal Aunt   . Diabetes Maternal Uncle   . Goiter Mother     BP 140/60 (BP Location: Left Arm, Patient Position: Sitting, Cuff Size: Large)   Pulse (!) 117   Ht 5\' 6"  (1.676 m)   Wt 239 lb 3.2 oz (108.5 kg)   SpO2 95%   BMI 38.61 kg/m    Review of Systems Denies fever.      Objective:   Physical Exam VITAL SIGNS:  See vs page GENERAL: no distress NECK: There is no palpable thyroid enlargement.  No thyroid nodule is palpable.  No palpable lymphadenopathy at the anterior neck.    Lab Results  Component Value  Date   TSH 3.48 07/09/2018      Assessment & Plan:  Hyperthyroidism: well-controlled Tachycardia: not thyroid-related.   Patient Instructions  blood tests are requested for you today.  We'll let you know about the results. If ever you have fever while taking methimazole, stop it and call us, even if the reason is obvious, because of the risk of a rare side-effect.   If the thyroid is not overactive today, please see Dr Pamella Pert, to see why your heart rate is fast. Please come back for a follow-up appointment in 3 months.

## 2018-07-09 NOTE — Patient Instructions (Addendum)
blood tests are requested for you today.  We'll let you know about the results. If ever you have fever while taking methimazole, stop it and call us, even if the reason is obvious, because of the risk of a rare side-effect.   If the thyroid is not overactive today, please see Dr Pamella Pert, to see why your heart rate is fast. Please come back for a follow-up appointment in 3 months.

## 2018-08-21 DIAGNOSIS — R69 Illness, unspecified: Secondary | ICD-10-CM | POA: Diagnosis not present

## 2018-09-09 ENCOUNTER — Other Ambulatory Visit: Payer: Self-pay | Admitting: Endocrinology

## 2018-10-09 ENCOUNTER — Ambulatory Visit: Payer: 59 | Admitting: Endocrinology

## 2018-12-28 ENCOUNTER — Other Ambulatory Visit: Payer: Self-pay

## 2019-01-01 ENCOUNTER — Ambulatory Visit (INDEPENDENT_AMBULATORY_CARE_PROVIDER_SITE_OTHER): Payer: 59 | Admitting: Endocrinology

## 2019-01-01 ENCOUNTER — Encounter: Payer: Self-pay | Admitting: Endocrinology

## 2019-01-01 ENCOUNTER — Other Ambulatory Visit: Payer: Self-pay

## 2019-01-01 VITALS — BP 104/72 | HR 118 | Ht 66.0 in | Wt 203.2 lb

## 2019-01-01 DIAGNOSIS — E0590 Thyrotoxicosis, unspecified without thyrotoxic crisis or storm: Secondary | ICD-10-CM | POA: Diagnosis not present

## 2019-01-01 LAB — T4, FREE: Free T4: 0.88 ng/dL (ref 0.60–1.60)

## 2019-01-01 LAB — TSH: TSH: 3.13 u[IU]/mL (ref 0.35–4.50)

## 2019-01-01 NOTE — Patient Instructions (Addendum)
blood tests are requested for you today.  We'll let you know about the results. If ever you have fever while taking methimazole, stop it and call us, even if the reason is obvious, because of the risk of a rare side-effect.   please see Dr Pamella Pert, to see why your heart rate is fast. Please come back for a follow-up appointment in 6 months.

## 2019-01-01 NOTE — Progress Notes (Signed)
Subjective:    Patient ID: Misty Cabrera, female    DOB: 1950-06-22, 68 y.o.   MRN: [id number]  HPI Pt returns for f/u of hyperthyroidism (dx'ed mid-2018; she has never had dedicated thyroid imaging, but chest CT made no mention of the thyroid; PE suggests nodular goiter; she declines RAI).  pt states she feels well in general.   She takes tapazole as rx'ed.  She has lost weight, due to her efforts.  Past Medical History:  Diagnosis Date  . Hyperthyroidism     Past Surgical History:  Procedure Laterality Date  . AUGMENTATION MAMMAPLASTY  1980  . TONSILLECTOMY      Social History   Socioeconomic History  . Marital status: Divorced    Spouse name: Not on file  . Number of children: Not on file  . Years of education: Not on file  . Highest education level: Not on file  Occupational History  . Not on file  Social Needs  . Financial resource strain: Not on file  . Food insecurity    Worry: Not on file    Inability: Not on file  . Transportation needs    Medical: Not on file    Non-medical: Not on file  Tobacco Use  . Smoking status: Never Smoker  . Smokeless tobacco: Never Used  Substance and Sexual Activity  . Alcohol use: No  . Drug use: No  . Sexual activity: Not Currently    Birth control/protection: None  Lifestyle  . Physical activity    Days per week: Not on file    Minutes per session: Not on file  . Stress: Not on file  Relationships  . Social Herbalist on phone: Not on file    Gets together: Not on file    Attends religious service: Not on file    Active member of club or organization: Not on file    Attends meetings of clubs or organizations: Not on file    Relationship status: Not on file  . Intimate partner violence    Fear of current or ex partner: Not on file    Emotionally abused: Not on file    Physically abused: Not on file    Forced sexual activity: Not on file  Other Topics Concern  . Not on file  Social History Narrative  .  Not on file    Current Outpatient Medications on File Prior to Visit  Medication Sig Dispense Refill  . losartan-hydrochlorothiazide (HYZAAR) 50-12.5 MG tablet TAKE 1 TABLET BY MOUTH EVERY DAY 90 tablet 1  . methimazole (TAPAZOLE) 5 MG tablet TAKE 1/2 TABLET BY MOUTH DAILY 45 tablet 1   No current facility-administered medications on file prior to visit.     No Known Allergies  Family History  Problem Relation Age of Onset  . Diabetes Maternal Aunt   . Diabetes Maternal Uncle   . Goiter Mother     BP 104/72 (BP Location: Left Arm, Patient Position: Sitting, Cuff Size: Large)   Pulse (!) 118   Ht 5\' 6"  (1.676 m)   Wt 203 lb 3.2 oz (92.2 kg)   SpO2 95%   BMI 32.80 kg/m   Review of Systems Denies fever.     Objective:   Physical Exam VITAL SIGNS:  See vs page.  GENERAL: no distress.  NECK: There is no palpable thyroid enlargement.  No thyroid nodule is palpable.  No palpable lymphadenopathy at the anterior neck.   Lab Results  Component  Value Date   TSH 3.13 01/01/2019      Assessment & Plan:  Hyperthyroidism: well-controlled.  Tachycardia: uncertain etiology.   Patient Instructions  blood tests are requested for you today.  We'll let you know about the results. If ever you have fever while taking methimazole, stop it and call us, even if the reason is obvious, because of the risk of a rare side-effect.   please see Dr Pamella Pert, to see why your heart rate is fast. Please come back for a follow-up appointment in 6 months.

## 2019-03-12 DIAGNOSIS — R69 Illness, unspecified: Secondary | ICD-10-CM | POA: Diagnosis not present

## 2019-03-18 ENCOUNTER — Other Ambulatory Visit: Payer: Self-pay | Admitting: Endocrinology

## 2019-03-18 DIAGNOSIS — Z1159 Encounter for screening for other viral diseases: Secondary | ICD-10-CM | POA: Diagnosis not present

## 2019-03-18 NOTE — Telephone Encounter (Signed)
Methimazole: Please refill prn  Losartan-HCTZ: Please forward refill request to pt's primary care provider.

## 2019-03-18 NOTE — Telephone Encounter (Signed)
Per Dr. Cordelia Pen request, I am forwarding you this refill request for Losartan. Please review and refill if appropriate

## 2019-03-18 NOTE — Telephone Encounter (Signed)
Please advise 

## 2019-03-18 NOTE — Telephone Encounter (Signed)
Patient last seen Feb 2019 by PCP Last BP at endo appt in July 2020 at goal Refilling for 30 days, needs OV

## 2019-03-22 NOTE — Telephone Encounter (Signed)
Spoke with pt and refused appt. Pt stated she is being managed by Dr. Loanne Drilling

## 2019-04-04 DIAGNOSIS — Z20828 Contact with and (suspected) exposure to other viral communicable diseases: Secondary | ICD-10-CM | POA: Diagnosis not present

## 2019-04-15 ENCOUNTER — Other Ambulatory Visit: Payer: Self-pay | Admitting: Family Medicine

## 2019-05-15 ENCOUNTER — Ambulatory Visit (INDEPENDENT_AMBULATORY_CARE_PROVIDER_SITE_OTHER): Payer: Medicare HMO | Admitting: Adult Health Nurse Practitioner

## 2019-05-15 ENCOUNTER — Other Ambulatory Visit: Payer: Self-pay

## 2019-05-15 ENCOUNTER — Encounter: Payer: Self-pay | Admitting: Adult Health Nurse Practitioner

## 2019-05-15 VITALS — BP 138/85 | HR 108 | Temp 96.1°F | Ht 66.0 in | Wt 206.0 lb

## 2019-05-15 DIAGNOSIS — I10 Essential (primary) hypertension: Secondary | ICD-10-CM | POA: Diagnosis not present

## 2019-05-15 DIAGNOSIS — Z8619 Personal history of other infectious and parasitic diseases: Secondary | ICD-10-CM

## 2019-05-15 DIAGNOSIS — E559 Vitamin D deficiency, unspecified: Secondary | ICD-10-CM | POA: Diagnosis not present

## 2019-05-15 DIAGNOSIS — E0590 Thyrotoxicosis, unspecified without thyrotoxic crisis or storm: Secondary | ICD-10-CM | POA: Diagnosis not present

## 2019-05-15 DIAGNOSIS — Z8616 Personal history of COVID-19: Secondary | ICD-10-CM

## 2019-05-15 DIAGNOSIS — Z23 Encounter for immunization: Secondary | ICD-10-CM

## 2019-05-15 DIAGNOSIS — R000 Tachycardia, unspecified: Secondary | ICD-10-CM | POA: Diagnosis not present

## 2019-05-15 HISTORY — DX: Tachycardia, unspecified: R00.0

## 2019-05-15 HISTORY — DX: Essential (primary) hypertension: I10

## 2019-05-15 LAB — POCT CBC
Granulocyte percent: 57.1 %G (ref 37–80)
HCT, POC: 41.1 % — AB (ref 29–41)
Hemoglobin: 14 g/dL (ref 11–14.6)
Lymph, poc: 2.4 (ref 0.6–3.4)
MCH, POC: 32.3 pg — AB (ref 27–31.2)
MCHC: 34 g/dL (ref 31.8–35.4)
MCV: 95.1 fL (ref 76–111)
MID (cbc): 0.3 (ref 0–0.9)
MPV: 8.4 fL (ref 0–99.8)
POC Granulocyte: 3.5 (ref 2–6.9)
POC LYMPH PERCENT: 38.5 %L (ref 10–50)
POC MID %: 4.4 %M (ref 0–12)
Platelet Count, POC: 256 10*3/uL (ref 142–424)
RBC: 4.33 M/uL (ref 4.04–5.48)
RDW, POC: 13.9 %
WBC: 6.2 10*3/uL (ref 4.6–10.2)

## 2019-05-15 MED ORDER — METOPROLOL TARTRATE 25 MG PO TABS: 12.5000 mg | ORAL_TABLET | Freq: Every day | ORAL | 6 refills | Status: DC

## 2019-05-15 NOTE — Patient Instructions (Signed)
° ° ° °  If you have lab work done today you will be contacted with your lab results within the next 2 weeks.  If you have not heard from us then please contact us. The fastest way to get your results is to register for My Chart. ° ° °IF you received an x-ray today, you will receive an invoice from [facility]. Please contact  Radiology at [phone] with questions or concerns regarding your invoice.  ° °IF you received labwork today, you will receive an invoice from [facility]. Please contact [facility] at [phone] with questions or concerns regarding your invoice.  ° °Our billing staff will not be able to assist you with questions regarding bills from these companies. ° °You will be contacted with the lab results as soon as they are available. The fastest way to get your results is to activate your My Chart account. Instructions are located on the last page of this paperwork. If you have not heard from us regarding the results in 2 weeks, please contact this office. °  ° ° ° °

## 2019-05-15 NOTE — Progress Notes (Deleted)
complete phy

## 2019-05-15 NOTE — Progress Notes (Signed)
Subjective:    Patient ID: Misty Cabrera, female    DOB: 1950-09-15, 68 y.o.   MRN: [id number]  HPI   Patient presents to follow-up and for refills of her antihypertensives.  She sees Dr. Ebony Hail for her hypothyroidism and is stable on Tapazole.  She has been having heart rates in the low 100s.  Does not necessarily feel symptomatic.  Was recommended by her endocrinologist to follow-up with Dr. Pamella Pert to discuss.  Patient denies any dizziness, shortness of breath, feeling tightness.  Her endocrinologist moved her from metoprolol to Hyzaar.  She likes the ability to get fluid off of her in the morning but her blood pressure still not quite controlled.  She does not have a blood pressure cuff but was getting on today.  One of her clients is going to help her get routine blood pressure measures.  She does not enjoy coming to the doctor and will only accept minimal preventative care recommendations.  She will get a pneumonia shot today.  She declined a DEXA scan and mammogram.  Will defer shingles for now.  Offered vitamin D testing given history of hypothyroid and likely higher risk to be osteoporotic.  Reports that prior to Covid she was exercising daily and went to [employer].  She had gotten down 250 pounds.  Has not been exercising since.  She is not even been walking and her job is sedentary.  We d Clair Gulling iscussed what small amounts of exercise she could insert into her week since the gym is an unsafe place for her.  Previous time from endocrine below. From Endocrine  Pt returns for f/u of hyperthyroidism (dx'ed mid-2018; she has never had dedicated thyroid imaging, but chest CT made no mention of the thyroid; PE suggests nodular goiter; she declines RAI).  pt states she feels well in general.   She takes tapazole as rx'ed.  She has lost weight, due to her efforts.   She is trying to lose weight still.  Would like to get back down to 150 one more time.  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  Review of Systems    Review of Systems  Constitutional: Negative for activity change, appetite change, chills and fever.  HENT: Negative for congestion, nosebleeds, trouble swallowing and voice change.   Respiratory: Negati my ve for cough, shortness of breath and wheezing.   Gastrointestinal: Negative for diarrhea, nausea and vomiting.  Genitourinary: Negative for difficulty urinating, dysuria, flank pain and hematuria.  Musculoskeletal: Negative for back pain, joint swelling and neck pain.  Neurological: Negative for dizziness, speech difficulty, light-headedness and numbness.  See HPI. All other review of systems negative.   Objective:   Physical Exam  Physical Exam  Constitutional: Oriented to person, place, and time. Appears well-developed and well-nourished.  HENT:  Head: Normocephalic and atraumatic.  Eyes: Conjunctivae and EOM are normal.  Neck:  Supple with nodules, movable palpated on the left side, non-tender Cardiovascular: Normal rate, regular rhythm, normal heart sounds and intact distal pulses.  No murmur heard. Pulmonary/Chest: Effort normal and breath sounds normal. No stridor. No respiratory distress. Has no wheezes.  Neurological: Is alert and oriented to person, place, and time.  Skin: Skin is warm. Capillary refill takes less than 2 seconds.  Psychiatric: Has a normal mood and affect. Behavior is normal. Judgment and thought content normal.        Assessment & Plan:    1. Essential hypertension, benign   2. Hyperthyroidism  3. Vitamin D deficiency   4. Tachycardia   5. Need for prophylactic vaccination and inoculation against influenza   6. History of 2019 novel coronavirus disease (COVID-19)     Meds ordered this encounter  Medications  . metoprolol tartrate (LOPRESSOR) 25 MG tablet    Sig: Take 0.5 tablets (12.5 mg total) by mouth daily.    Dispense:  15 tablet    Refill:  6    Lab Orders     Thyroid Panel With TSH      Lipid panel     CMP14+EGFR     Vitamin D 25 (osteoporosis screening)     POCT CBC     I have kept the patient on the Hyzaar and will add a very small dose of the metoprolol to see if it affects her heart rate.  If blood pressure remains mid 130s and she is still tachycardic will increase after she gives me some values that she has been consistently taking from home.  She verbalized understanding.  We will follow-up in 6 months.  Patient satisfied with plan.

## 2019-05-16 ENCOUNTER — Other Ambulatory Visit: Payer: Self-pay | Admitting: Family Medicine

## 2019-05-16 LAB — CMP14+EGFR
ALT: 14 IU/L (ref 0–32)
AST: 19 IU/L (ref 0–40)
Albumin/Globulin Ratio: 1.7 (ref 1.2–2.2)
Albumin: 4.3 g/dL (ref 3.8–4.8)
Alkaline Phosphatase: 82 IU/L (ref 39–117)
BUN/Creatinine Ratio: 28 (ref 12–28)
BUN: 28 mg/dL — ABNORMAL HIGH (ref 8–27)
Bilirubin Total: 0.4 mg/dL (ref 0.0–1.2)
CO2: 22 mmol/L (ref 20–29)
Calcium: 9 mg/dL (ref 8.7–10.3)
Chloride: 105 mmol/L (ref 96–106)
Creatinine, Ser: 0.99 mg/dL (ref 0.57–1.00)
GFR calc Af Amer: 67 mL/min/{1.73_m2} (ref >59)
GFR calc non Af Amer: 58 mL/min/{1.73_m2} — ABNORMAL LOW (ref >59)
Globulin, Total: 2.5 g/dL (ref 1.5–4.5)
Glucose: 92 mg/dL (ref 65–99)
Potassium: 4.8 mmol/L (ref 3.5–5.2)
Sodium: 140 mmol/L (ref 134–144)
Total Protein: 6.8 g/dL (ref 6.0–8.5)

## 2019-05-16 LAB — THYROID PANEL WITH TSH
Free Thyroxine Index: 2 (ref 1.2–4.9)
T3 Uptake Ratio: 27 % (ref 24–39)
T4, Total: 7.4 ug/dL (ref 4.5–12.0)
TSH: 3.68 u[IU]/mL (ref 0.450–4.500)

## 2019-05-16 LAB — LIPID PANEL
Chol/HDL Ratio: 3.5 ratio (ref 0.0–4.4)
Cholesterol, Total: 266 mg/dL — ABNORMAL HIGH (ref 100–199)
HDL: 75 mg/dL (ref >39)
LDL Chol Calc (NIH): 179 mg/dL — ABNORMAL HIGH (ref 0–99)
Triglycerides: 76 mg/dL (ref 0–149)
VLDL Cholesterol Cal: 12 mg/dL (ref 5–40)

## 2019-05-16 LAB — VITAMIN D 25 HYDROXY (VIT D DEFICIENCY, FRACTURES): Vit D, 25-Hydroxy: 44.5 ng/mL (ref 30.0–100.0)

## 2019-05-16 MED ORDER — LOSARTAN POTASSIUM-HCTZ 50-12.5 MG PO TABS: 1.0000 | ORAL_TABLET | Freq: Every day | ORAL | 0 refills | Status: DC

## 2019-05-16 NOTE — Telephone Encounter (Signed)
Copied from [place] [id number]. Topic: Quick Communication - Rx Refill/Question >> May 16, 2019  5:00 PM Misty Cabrera, [place] A wrote: Medication: losartan-hydrochlorothiazide (HYZAAR) 50-12.5 MG tablet (Patient stated that this medication was not sent to pharmacy after her visit yesterday.)  Has the patient contacted their pharmacy? Yes (Agent: If no, request that the patient contact the pharmacy for the refill.) (Agent: If yes, when and what did the pharmacy advise?)Contact PCP  Preferred Pharmacy (with phone number or street name): [id number] [address], [place] [phone] (Phone) [phone] (Fax)    Agent: Please be advised that RX refills may take up to 3 business days. We ask that you follow-up with your pharmacy.

## 2019-05-17 ENCOUNTER — Telehealth: Payer: Self-pay | Admitting: Family Medicine

## 2019-05-17 ENCOUNTER — Ambulatory Visit: Payer: Self-pay | Admitting: *Deleted

## 2019-05-17 MED ORDER — LOSARTAN POTASSIUM-HCTZ 50-12.5 MG PO TABS: 1.0000 | ORAL_TABLET | Freq: Every day | ORAL | 1 refills | Status: DC

## 2019-05-17 NOTE — Telephone Encounter (Signed)
Pt called and stated that only 30 days of losartan-hydrochlorothiazide (HYZAAR) 50-12.5 MG tablet [id number] was called in. Pt states that she was just at the office and would like to know if more can be called in. Please advise.

## 2019-05-17 NOTE — Telephone Encounter (Signed)
I returned her call.    She is requesting the Hyzaar have a 6 month refill like the metoprolol.   She saw Hazle Nordmann, NP and was started on the metoprolol with a 6 month refill.   She's been on the Hyzaar.  I let her know it would be changed to a 6 month refill since that's when her F/U is due.   She was agreeable to this.  I checked Adriana Mccallum office note and pt is to follow up in 6 months so I changed the Rx for the Hyzaar for 90 pills with 1 refill to get her to her 6 month F/U.       Reason for Disposition . Caller requesting a refill, no triage required, and triager able to refill per unit policy  Answer Assessment - Initial Assessment Questions 1.   NAME of MEDICATION: "What medicine are you calling about?"     Do I take the Losartan and the Metoprolol?    2.   QUESTION: "What is your question?"     I've been on both.    I saw the NP and she kept me on the losartan and to take the 1/2 a pill of the metoprolol, I think? 3.   PRESCRIBING HCP: "Who prescribed it?" Reason: if prescribed by specialist, call should be referred to that group.     The NP 4. SYMPTOMS: "Do you have any symptoms?"     No 5. SEVERITY: If symptoms are present, ask "Are they mild, moderate or severe?"     No 6.  PREGNANCY:  "Is there any chance that you are pregnant?" "When was your last menstrual period?"     N/A  Protocols used: MEDICATION QUESTION CALL-A-AH

## 2019-05-28 ENCOUNTER — Telehealth: Payer: Self-pay

## 2019-05-28 NOTE — Telephone Encounter (Signed)
Pls review the the note from NP. Pt is asking for a supply metoprolol to go along with the yrs supply of losartan-hyzaar. Pt has not been monitoring her bp. Says she does have the wrist cuff jsut does not know how to use it. She will have one of her nurses show her.  Note for Jens Som...Marland KitchenMarland KitchenI have kept the patient on the Hyzaar and will add a very small dose of the metoprolol to see if it affects her heart rate.  If blood pressure remains mid 130s and she is still tachycardic will increase after she gives me some values that she has been consistently taking from home. She verbalized understanding.  We will follow-up in 6 months.  Patient satisfied with plan.

## 2019-05-28 NOTE — Telephone Encounter (Signed)
Please let patient know that a years worth of metoprolol is not appropriate as unsure of final dose. I would like to actually see her in a month instead of 6 months to make sure her BP and pulse are tolerating new medication. thanks

## 2019-07-08 ENCOUNTER — Ambulatory Visit: Payer: 59 | Admitting: Endocrinology

## 2019-07-26 ENCOUNTER — Encounter: Payer: Self-pay | Admitting: Adult Health Nurse Practitioner

## 2019-09-15 ENCOUNTER — Other Ambulatory Visit: Payer: Self-pay | Admitting: Endocrinology

## 2019-09-15 NOTE — Telephone Encounter (Signed)
1.  Please schedule f/u appt 2.  Then please refill x 1, pending that appt.  

## 2019-09-17 DIAGNOSIS — R69 Illness, unspecified: Secondary | ICD-10-CM | POA: Diagnosis not present

## 2019-10-01 ENCOUNTER — Ambulatory Visit (INDEPENDENT_AMBULATORY_CARE_PROVIDER_SITE_OTHER): Payer: 59 | Admitting: Endocrinology

## 2019-10-01 ENCOUNTER — Encounter: Payer: Self-pay | Admitting: Endocrinology

## 2019-10-01 ENCOUNTER — Other Ambulatory Visit: Payer: Self-pay

## 2019-10-01 VITALS — BP 120/88 | HR 90 | Ht 66.0 in | Wt 222.0 lb

## 2019-10-01 DIAGNOSIS — E0590 Thyrotoxicosis, unspecified without thyrotoxic crisis or storm: Secondary | ICD-10-CM | POA: Diagnosis not present

## 2019-10-01 LAB — T4, FREE: Free T4: 0.83 ng/dL (ref 0.60–1.60)

## 2019-10-01 LAB — TSH: TSH: 3.84 u[IU]/mL (ref 0.35–4.50)

## 2019-10-01 NOTE — Patient Instructions (Addendum)
Blood tests are requested for you today.  We'll let you know about the results.  If ever you have fever while taking methimazole, stop it and call us, even if the reason is obvious, because of the risk of a rare side-effect. It is best to never miss the medication.  However, if you do miss it, next best is to double up the next time.  Let's check the ultrasound.  you will receive a phone call, about a day and time for an appointment.  Please come back for a follow-up appointment in 6 months.

## 2019-10-01 NOTE — Progress Notes (Signed)
Subjective:    Patient ID: Misty Cabrera, female    DOB: 03-Apr-1951, 69 y.o.   MRN: [id number]  HPI Pt returns for f/u of hyperthyroidism (dx'ed mid-2018; she has never had dedicated thyroid imaging, but chest CT made no mention of the thyroid; PE suggests nodular goiter; she declines RAI).  pt states she feels well in general.   She takes tapazole as rx'ed.   Past Medical History:  Diagnosis Date  . Essential hypertension, benign 05/15/2019  . Hyperthyroidism   . Tachycardia 05/15/2019    Past Surgical History:  Procedure Laterality Date  . AUGMENTATION MAMMAPLASTY  1980  . TONSILLECTOMY      Social History   Socioeconomic History  . Marital status: Divorced    Spouse name: Not on file  . Number of children: Not on file  . Years of education: Not on file  . Highest education level: Not on file  Occupational History  . Not on file  Tobacco Use  . Smoking status: Never Smoker  . Smokeless tobacco: Never Used  Substance and Sexual Activity  . Alcohol use: No  . Drug use: No  . Sexual activity: Not Currently    Birth control/protection: None  Other Topics Concern  . Not on file  Social History Narrative   Lives with son who is 22 and going to school for IT sales professional   Social Determinants of Health   Financial Resource Strain:   . Difficulty of Paying Living Expenses:   Food Insecurity:   . Worried About Charity fundraiser in the Last Year:   . Arboriculturist in the Last Year:   Transportation Needs:   . Film/video editor (Medical):   Marland Kitchen Lack of Transportation (Non-Medical):   Physical Activity: Inactive  . Days of Exercise per Week: 0 days  . Minutes of Exercise per Session: 0 min  Stress:   . Feeling of Stress :   Social Connections:   . Frequency of Communication with Friends and Family:   . Frequency of Social Gatherings with Friends and Family:   . Attends Religious Services:   . Active Member of Clubs or Organizations:   . Attends Theatre manager Meetings:   Marland Kitchen Marital Status:   Intimate Partner Violence:   . Fear of Current or Ex-Partner:   . Emotionally Abused:   Marland Kitchen Physically Abused:   . Sexually Abused:     Current Outpatient Medications on File Prior to Visit  Medication Sig Dispense Refill  . losartan-hydrochlorothiazide (HYZAAR) 50-12.5 MG tablet Take 1 tablet by mouth daily. 90 tablet 1  . methimazole (TAPAZOLE) 5 MG tablet TAKE 1/2 TABLET BY MOUTH EVERY DAY 45 tablet 1  . metoprolol tartrate (LOPRESSOR) 25 MG tablet Take 0.5 tablets (12.5 mg total) by mouth daily. 15 tablet 6   No current facility-administered medications on file prior to visit.    No Known Allergies  Family History  Problem Relation Age of Onset  . Diabetes Maternal Aunt   . Diabetes Maternal Uncle   . Goiter Mother     BP 120/88   Pulse 90   Ht 5\' 6"  (1.676 m)   Wt 222 lb (100.7 kg)   SpO2 98%   BMI 35.83 kg/m    Review of Systems Denies fever.      Objective:   Physical Exam VITAL SIGNS:  See vs page.  GENERAL: no distress.  NECK: thyroid is slightly enlarged, with slightly irreg surface.  No  thyroid nodule is palpable.  No palpable lymphadenopathy at the anterior neck.    Lab Results  Component Value Date   TSH 3.84 10/01/2019   T4TOTAL 7.4 05/15/2019      Assessment & Plan:  Hyperthyroidism: well-controlled.  Please continue the same medication.  MNG: check [place] Please come back for a follow-up appointment in 6 months

## 2019-10-02 ENCOUNTER — Telehealth: Payer: Self-pay

## 2019-10-02 NOTE — Telephone Encounter (Signed)
LAB RESULTS  Lab results were reviewed by Dr. Ellison. A letter has been mailed to pt home address. For future reference, letter can be found in Epic. 

## 2019-10-02 NOTE — Telephone Encounter (Signed)
-----   Message from Renato Shin, MD sent at 10/01/2019  5:32 PM EDT ----- please contact patient: Normal. Please continue the same medication.  I'll see you next time.

## 2019-10-29 ENCOUNTER — Ambulatory Visit
Admission: RE | Admit: 2019-10-29 | Discharge: 2019-10-29 | Disposition: A | Discharge status: Home / Self Care | Payer: 59 | Source: Ambulatory Visit | Attending: Endocrinology | Admitting: Endocrinology

## 2019-10-29 DIAGNOSIS — E041 Nontoxic single thyroid nodule: Secondary | ICD-10-CM | POA: Diagnosis not present

## 2019-10-29 DIAGNOSIS — E0590 Thyrotoxicosis, unspecified without thyrotoxic crisis or storm: Secondary | ICD-10-CM

## 2019-10-30 ENCOUNTER — Telehealth: Payer: Self-pay

## 2019-10-30 NOTE — Telephone Encounter (Signed)
-----   Message from Renato Shin, MD sent at 10/29/2019  6:17 PM EDT ----- please contact patient: No change--good.  All we need to do is to recheck this net year.

## 2019-10-30 NOTE — Telephone Encounter (Signed)
IMAGING RESULTS  Imaging results were reviewed by Dr. Loanne Drilling. A letter has been mailed to pt home address. For future reference, letter can be found in [facility].

## 2019-11-20 ENCOUNTER — Other Ambulatory Visit: Payer: Self-pay | Admitting: Endocrinology

## 2019-11-22 ENCOUNTER — Other Ambulatory Visit: Payer: Self-pay | Admitting: Family Medicine

## 2019-11-22 MED ORDER — LOSARTAN POTASSIUM-HCTZ 50-12.5 MG PO TABS: 1.0000 | ORAL_TABLET | Freq: Every day | ORAL | 0 refills | Status: DC

## 2019-11-22 MED ORDER — METOPROLOL TARTRATE 25 MG PO TABS: 12.5000 mg | ORAL_TABLET | Freq: Every day | ORAL | 0 refills | Status: DC

## 2019-11-22 NOTE — Telephone Encounter (Signed)
Pt. Called requesting medications listed in previous message be filled up to her appt. Scheduled for 12/31/2019.

## 2019-11-22 NOTE — Telephone Encounter (Signed)
Patient requesting losartan-hydrochlorothiazide (HYZAAR) 50-12.5 MG tablet and metoprolol tartrate (LOPRESSOR) 25 MG tablet, informed patient please allow 48 to 72 hour turn around time. Patient states she will schedule follow up appt with PCP.   [id number] [address], [place] Phone:  [phone]  Fax:  [phone]

## 2019-12-31 ENCOUNTER — Other Ambulatory Visit: Payer: Self-pay

## 2019-12-31 ENCOUNTER — Encounter: Payer: Self-pay | Admitting: Family Medicine

## 2019-12-31 ENCOUNTER — Ambulatory Visit (INDEPENDENT_AMBULATORY_CARE_PROVIDER_SITE_OTHER): Payer: Medicare HMO | Admitting: Family Medicine

## 2019-12-31 VITALS — BP 119/81 | HR 100 | Temp 98.0°F | Ht 66.0 in | Wt 220.0 lb

## 2019-12-31 DIAGNOSIS — E0590 Thyrotoxicosis, unspecified without thyrotoxic crisis or storm: Secondary | ICD-10-CM

## 2019-12-31 DIAGNOSIS — I10 Essential (primary) hypertension: Secondary | ICD-10-CM | POA: Diagnosis not present

## 2019-12-31 DIAGNOSIS — Z1211 Encounter for screening for malignant neoplasm of colon: Secondary | ICD-10-CM

## 2019-12-31 DIAGNOSIS — Z780 Asymptomatic menopausal state: Secondary | ICD-10-CM | POA: Diagnosis not present

## 2019-12-31 MED ORDER — METOPROLOL TARTRATE 25 MG PO TABS: 12.5000 mg | ORAL_TABLET | Freq: Every day | ORAL | 2 refills | Status: DC

## 2019-12-31 MED ORDER — LOSARTAN POTASSIUM-HCTZ 50-12.5 MG PO TABS: 1.0000 | ORAL_TABLET | Freq: Every day | ORAL | 2 refills | Status: DC

## 2019-12-31 NOTE — Patient Instructions (Signed)
° ° ° °  If you have lab work done today you will be contacted with your lab results within the next 2 weeks.  If you have not heard from us then please contact us. The fastest way to get your results is to register for My Chart. ° ° °IF you received an x-ray today, you will receive an invoice from [facility]. Please contact [facility] at [phone] with questions or concerns regarding your invoice.  ° °IF you received labwork today, you will receive an invoice from [facility]. Please contact [facility] at [phone] with questions or concerns regarding your invoice.  ° °Our billing staff will not be able to assist you with questions regarding bills from these companies. ° °You will be contacted with the lab results as soon as they are available. The fastest way to get your results is to activate your My Chart account. Instructions are located on the last page of this paperwork. If you have not heard from us regarding the results in 2 weeks, please contact this office. °  ° ° ° °

## 2019-12-31 NOTE — Progress Notes (Signed)
7/20/202111:18 AM  Misty Cabrera Sep 23, 1950, 69 y.o., female [id number]  Chief Complaint  Patient presents with  . Hypertension    med refills   . Transitions Of Care    HPI:   Patient is a 69 y.o. female with past medical history significant for HTN, hyperthyroidism, vitamin D deficiency who presents today for TOC  Previous PCP Felton Clinton, NP - last OV dec 2020 Endo Dr Loanne Drilling - sees twice a year  She is overall doing well She has not acute concerns today She takes metorpolol, Office manager daily for BP She does not recall ever having a done density She is due for colon cancer screening, has done cologuard in the past, reports negative She declines mammogram given she has breast implants  Wt Readings from Last 3 Encounters:  12/31/19 220 lb (99.8 kg)  10/01/19 222 lb (100.7 kg)  05/15/19 206 lb (93.4 kg)   BP Readings from Last 3 Encounters:  12/31/19 119/81  10/01/19 120/88  05/15/19 138/85    Depression screen PHQ 2/9 12/31/2019 05/15/2019 05/15/2019  Decreased Interest 0 0 0  Down, Depressed, Hopeless 0 0 0  PHQ - 2 Score 0 0 0    Fall Risk  12/31/2019 05/15/2019 07/25/2017 02/23/2017 12/31/2016  Falls in the past year? 0 0 No Yes No  Number falls in past yr: 0 0 - 1 -  Injury with Fall? 0 0 - No -  Follow up Falls evaluation completed Falls evaluation completed - - -     No Known Allergies  Prior to Admission medications   Medication Sig Start Date End Date Taking? Authorizing Provider  losartan-hydrochlorothiazide (HYZAAR) 50-12.5 MG tablet Take 1 tablet by mouth daily. 11/22/19  Yes Rutherford Guys, MD  methimazole (TAPAZOLE) 5 MG tablet TAKE 1/2 TABLET BY MOUTH EVERY DAY 11/20/19  Yes Renato Shin, MD  metoprolol tartrate (LOPRESSOR) 25 MG tablet Take 0.5 tablets (12.5 mg total) by mouth daily. 11/22/19 12/31/19 Yes Rutherford Guys, MD    Past Medical History:  Diagnosis Date  . Essential hypertension, benign 05/15/2019  . Hyperthyroidism   . Tachycardia  05/15/2019    Past Surgical History:  Procedure Laterality Date  . AUGMENTATION MAMMAPLASTY  1980  . TONSILLECTOMY      Social History   Tobacco Use  . Smoking status: Never Smoker  . Smokeless tobacco: Never Used  Substance Use Topics  . Alcohol use: No    Family History  Problem Relation Age of Onset  . Diabetes Maternal Aunt   . Diabetes Maternal Uncle   . Goiter Mother     Review of Systems  Constitutional: Negative for chills and fever.  Respiratory: Negative for cough and shortness of breath.   Cardiovascular: Negative for chest pain, palpitations and leg swelling.  Gastrointestinal: Negative for abdominal pain, nausea and vomiting.     OBJECTIVE:  Today's Vitals   12/31/19 1056 12/31/19 1113  BP: 119/81   Pulse: (!) 115 100  Temp: 98 F (36.7 C)   SpO2: 96%   Weight: 220 lb (99.8 kg)   Height: '5\' 6"'  (1.676 m)    Body mass index is 35.51 kg/m.   Physical Exam Vitals and nursing note reviewed.  Constitutional:      Appearance: She is well-developed.  HENT:     Head: Normocephalic and atraumatic.     Mouth/Throat:     Pharynx: No oropharyngeal exudate.  Eyes:     General: No scleral icterus.    Conjunctiva/sclera: Conjunctivae normal.  Pupils: Pupils are equal, round, and reactive to light.  Cardiovascular:     Rate and Rhythm: Normal rate and regular rhythm.     Heart sounds: Normal heart sounds. No murmur heard.  No friction rub. No gallop.   Pulmonary:     Effort: Pulmonary effort is normal.     Breath sounds: Normal breath sounds. No wheezing, rhonchi or rales.  Musculoskeletal:     Cervical back: Neck supple.     Right lower leg: No edema.     Left lower leg: No edema.  Skin:    General: Skin is warm and dry.  Neurological:     Mental Status: She is alert and oriented to person, place, and time.     No results found for this or any previous visit (from the past 24 hour(s)).  No results found.   ASSESSMENT and PLAN  1.  Essential hypertension, benign Controlled. Continue current regime.  - CMP14+EGFR  2. Hyperthyroidism Managed by endo  3. Postmenopausal estrogen deficiency - DG Bone Density; Future  4. Colon cancer screening - Cologuard  Other orders - losartan-hydrochlorothiazide (HYZAAR) 50-12.5 MG tablet; Take 1 tablet by mouth daily. - metoprolol tartrate (LOPRESSOR) 25 MG tablet; Take 0.5 tablets (12.5 mg total) by mouth daily.  Return in about 6 months (around 07/02/2020).    Rutherford Guys, MD [facility] [place] [address], [place] [address] Ph.  [phone] Fax [phone]

## 2020-03-02 ENCOUNTER — Telehealth: Payer: Self-pay | Admitting: *Deleted

## 2020-03-02 NOTE — Telephone Encounter (Signed)
Schedule AWV patient will call back

## 2020-04-02 ENCOUNTER — Ambulatory Visit (INDEPENDENT_AMBULATORY_CARE_PROVIDER_SITE_OTHER): Payer: Medicare HMO | Admitting: Endocrinology

## 2020-04-02 ENCOUNTER — Other Ambulatory Visit: Payer: Self-pay

## 2020-04-02 VITALS — BP 180/100 | HR 96 | Ht 66.0 in | Wt 225.4 lb

## 2020-04-02 DIAGNOSIS — Z23 Encounter for immunization: Secondary | ICD-10-CM | POA: Diagnosis not present

## 2020-04-02 DIAGNOSIS — E0590 Thyrotoxicosis, unspecified without thyrotoxic crisis or storm: Secondary | ICD-10-CM | POA: Diagnosis not present

## 2020-04-02 LAB — T4, FREE: Free T4: 0.69 ng/dL (ref 0.60–1.60)

## 2020-04-02 LAB — TSH: TSH: 4.45 u[IU]/mL (ref 0.35–4.50)

## 2020-04-02 NOTE — Progress Notes (Signed)
Subjective:    Patient ID: Misty Cabrera, female    DOB: 03-Apr-1951, 69 y.o.   MRN: [id number]  HPI Pt returns for f/u of hyperthyroidism (dx'ed mid-2018; [place] (2021) showed 1.9 cm RLP nodule--advise f/u 1 year; she declines RAI).  pt states she feels well in general, except for fatigue. She takes tapazole as rx'ed.   Past Medical History:  Diagnosis Date  . Essential hypertension, benign 05/15/2019  . Hyperthyroidism   . Tachycardia 05/15/2019    Past Surgical History:  Procedure Laterality Date  . AUGMENTATION MAMMAPLASTY  1980  . TONSILLECTOMY      Social History   Socioeconomic History  . Marital status: Divorced    Spouse name: Not on file  . Number of children: Not on file  . Years of education: Not on file  . Highest education level: Not on file  Occupational History  . Not on file  Tobacco Use  . Smoking status: Never Smoker  . Smokeless tobacco: Never Used  Vaping Use  . Vaping Use: Never used  Substance and Sexual Activity  . Alcohol use: No  . Drug use: No  . Sexual activity: Not Currently    Birth control/protection: None  Other Topics Concern  . Not on file  Social History Narrative   Lives with son who is 31 and going to school for IT sales professional   Social Determinants of Health   Financial Resource Strain:   . Difficulty of Paying Living Expenses: Not on file  Food Insecurity:   . Worried About Charity fundraiser in the Last Year: Not on file  . Ran Out of Food in the Last Year: Not on file  Transportation Needs:   . Lack of Transportation (Medical): Not on file  . Lack of Transportation (Non-Medical): Not on file  Physical Activity: Inactive  . Days of Exercise per Week: 0 days  . Minutes of Exercise per Session: 0 min  Stress:   . Feeling of Stress : Not on file  Social Connections:   . Frequency of Communication with Friends and Family: Not on file  . Frequency of Social Gatherings with Friends and Family: Not on file  . Attends  Religious Services: Not on file  . Active Member of Clubs or Organizations: Not on file  . Attends Archivist Meetings: Not on file  . Marital Status: Not on file  Intimate Partner Violence:   . Fear of Current or Ex-Partner: Not on file  . Emotionally Abused: Not on file  . Physically Abused: Not on file  . Sexually Abused: Not on file    Current Outpatient Medications on File Prior to Visit  Medication Sig Dispense Refill  . losartan-hydrochlorothiazide (HYZAAR) 50-12.5 MG tablet Take 1 tablet by mouth daily. 90 tablet 2  . methimazole (TAPAZOLE) 5 MG tablet TAKE 1/2 TABLET BY MOUTH EVERY DAY 45 tablet 1  . metoprolol tartrate (LOPRESSOR) 25 MG tablet Take 0.5 tablets (12.5 mg total) by mouth daily. 45 tablet 2   No current facility-administered medications on file prior to visit.    No Known Allergies  Family History  Problem Relation Age of Onset  . Diabetes Maternal Aunt   . Diabetes Maternal Uncle   . Goiter Mother     BP (!) 180/100 (BP Location: Left Arm, Patient Position: Sitting, Cuff Size: Normal)   Pulse 96   Ht 5\' 6"  (1.676 m)   Wt 225 lb 6.4 oz (102.2 kg)   SpO2 97%  BMI 36.38 kg/m    Review of Systems Denies fever.      Objective:   Physical Exam VITAL SIGNS:  See vs page.  GENERAL: no distress.  NECK: right sided thyroid nodule is palpable.  No palpable lymphadenopathy at the anterior neck.     Lab Results  Component Value Date   TSH 4.45 04/02/2020   T4TOTAL 7.4 05/15/2019      Assessment & Plan:  Hyperthyroidism: well-controlled.  Please continue the same tapazole.  Thyroid nodule: clinically stable. We'll follow.

## 2020-04-02 NOTE — Patient Instructions (Addendum)
Your blood pressure is high today.  Please see your primary care provider soon, to have it rechecked Blood tests are requested for you today.  We'll let you know about the results.  If ever you have fever while taking methimazole, stop it and call us, even if the reason is obvious, because of the risk of a rare side-effect.  It is best to never miss the medication.  However, if you do miss it, next best is to double up the next time.   Please come back for a follow-up appointment in 6 months.   

## 2020-04-03 ENCOUNTER — Telehealth: Payer: Self-pay

## 2020-04-03 ENCOUNTER — Telehealth: Payer: Self-pay | Admitting: Endocrinology

## 2020-04-03 NOTE — Telephone Encounter (Signed)
Patient aware of results and recommendations. °

## 2020-04-03 NOTE — Telephone Encounter (Signed)
Left message for patient to call back to regarding results and recommendations.

## 2020-04-03 NOTE — Telephone Encounter (Signed)
Patient returned Shannon's call and requests to be called at ph# [phone]

## 2020-04-03 NOTE — Telephone Encounter (Signed)
-----   Message from Renato Shin, MD sent at 04/02/2020  6:35 PM EDT ----- please contact patient: Normal.  Please continue the same medication

## 2020-04-08 ENCOUNTER — Encounter: Payer: Self-pay | Admitting: Family Medicine

## 2020-04-14 DIAGNOSIS — R69 Illness, unspecified: Secondary | ICD-10-CM | POA: Diagnosis not present

## 2020-04-23 ENCOUNTER — Telehealth: Payer: Self-pay | Admitting: *Deleted

## 2020-04-23 NOTE — Telephone Encounter (Signed)
Schedule a mammogram  

## 2020-05-01 DIAGNOSIS — R69 Illness, unspecified: Secondary | ICD-10-CM | POA: Diagnosis not present

## 2020-05-14 DIAGNOSIS — R69 Illness, unspecified: Secondary | ICD-10-CM | POA: Diagnosis not present

## 2020-05-16 ENCOUNTER — Other Ambulatory Visit: Payer: Self-pay | Admitting: Endocrinology

## 2020-06-06 IMAGING — US US THYROID
1 series · 13 of 25 positions shown · non-contrast
Comparison: None.

CLINICAL DATA: Palpable abnormality. 68-year-old female with

EXAM:
THYROID ULTRASOUND
TECHNIQUE: Ultrasound examination of the thyroid gland and adjacent soft
tissues was performed.

[Series 1: us thyroid · 0.06mm/px · 13 of 44 slices shown]
[im 1/44]
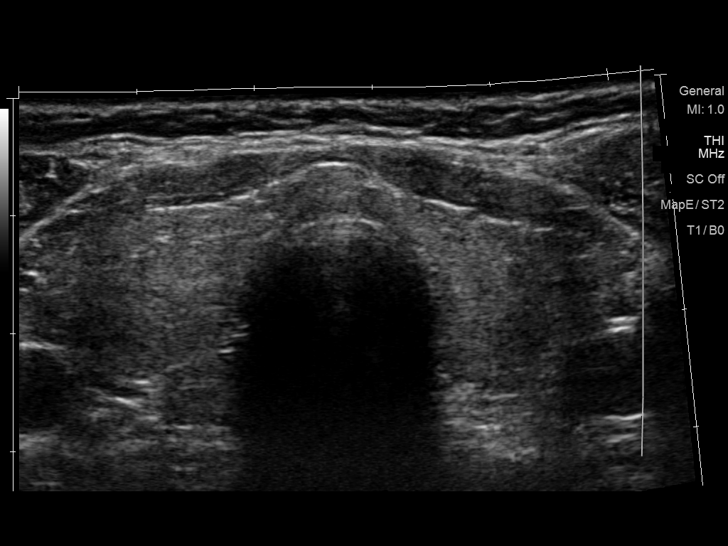
[im 4/44]
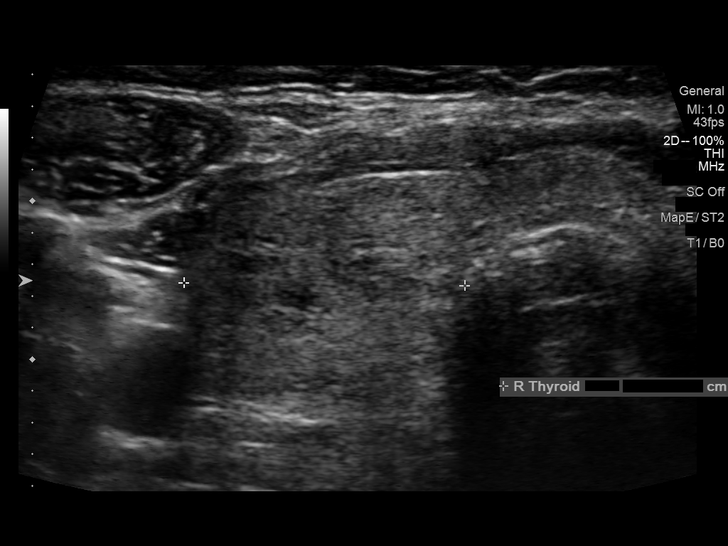
[im 8/44]
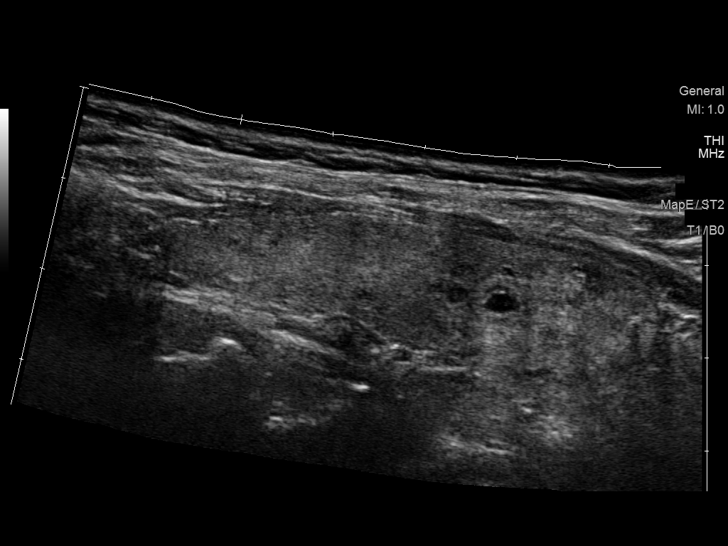
[im 11/44]
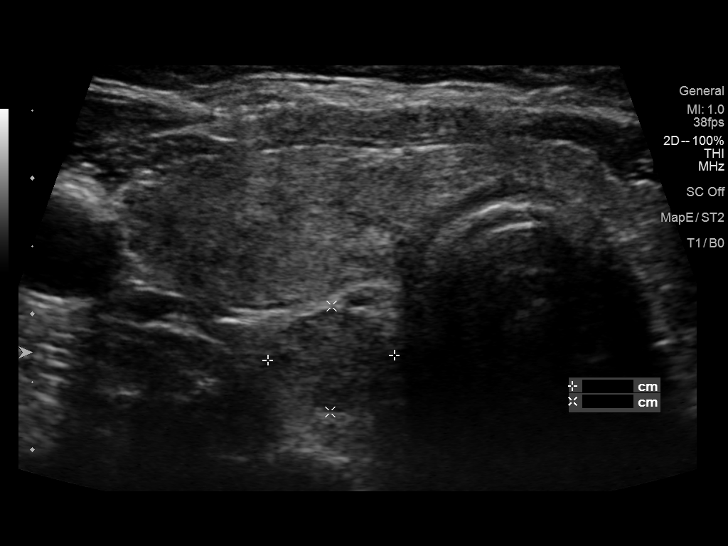
[im 15/44]
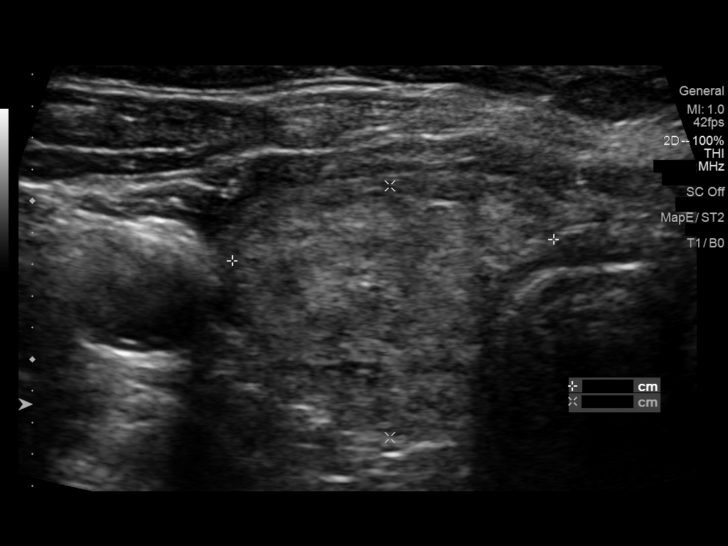
[im 18/44]
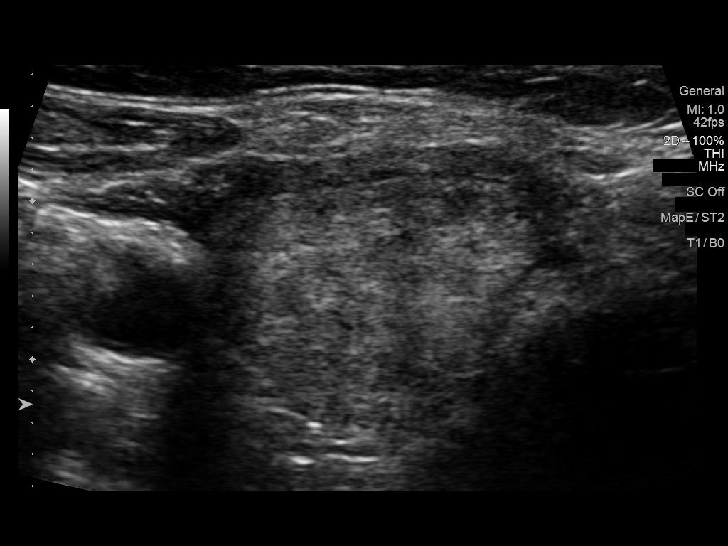
[im 22/44]
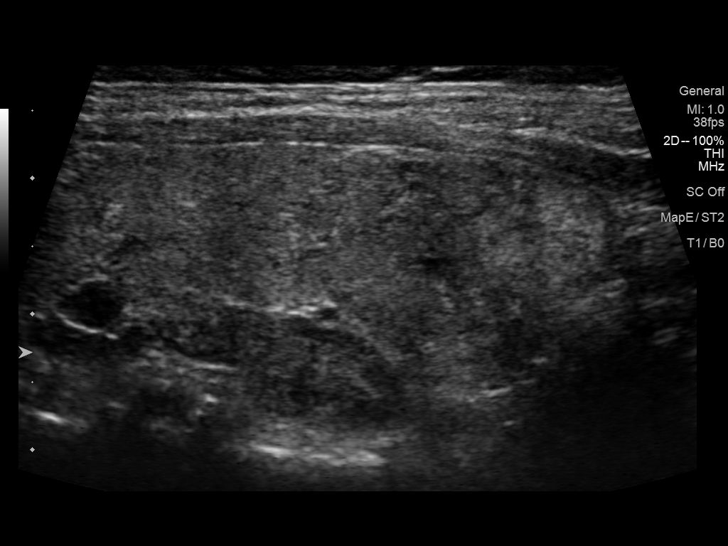
[im 26/44]
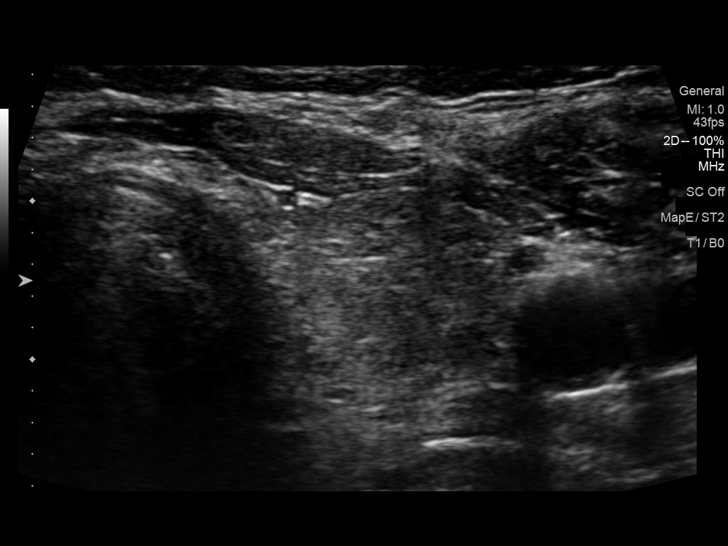
[im 29/44]
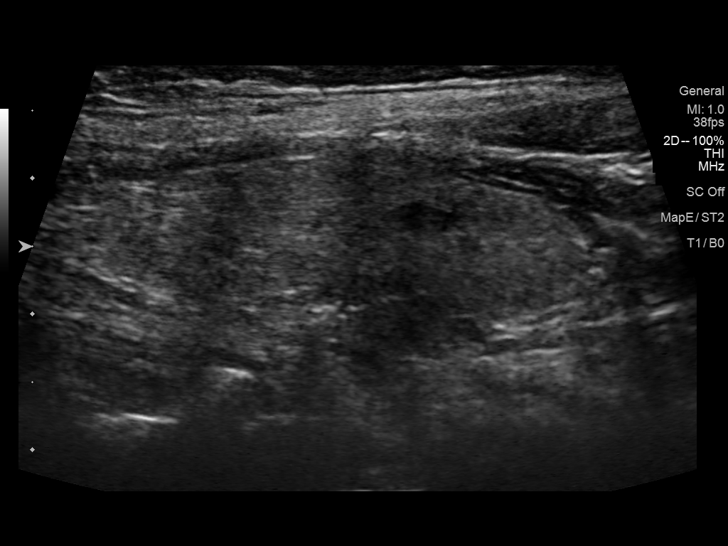
[im 33/44]
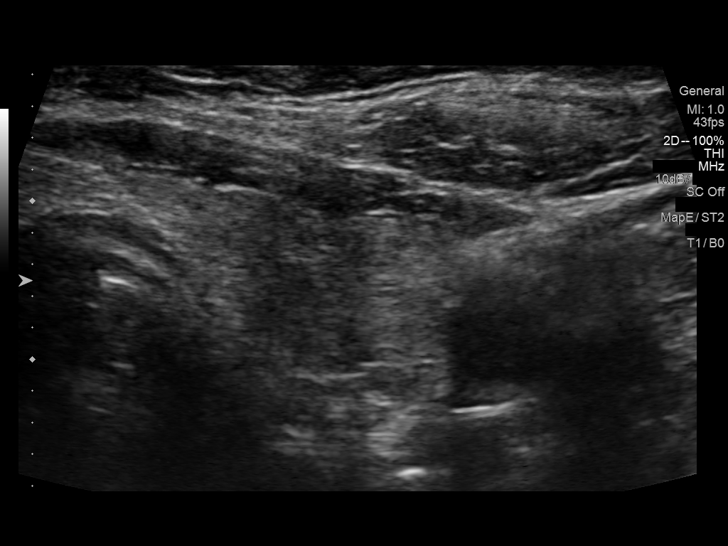
[im 36/44]
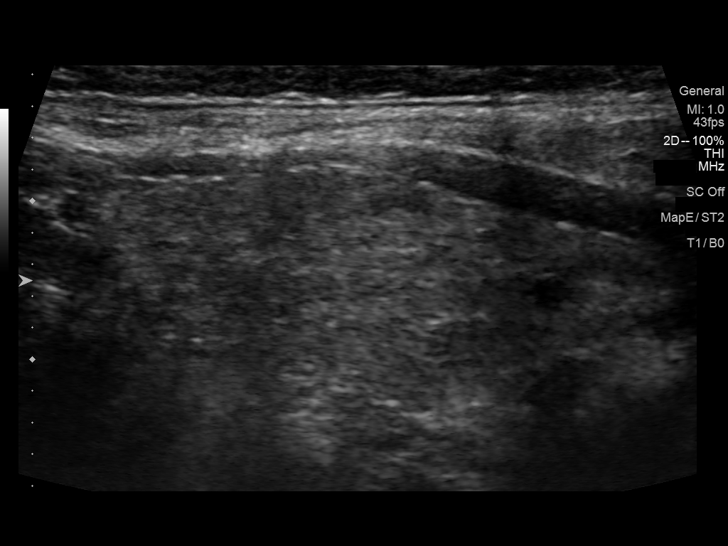
[im 40/44]
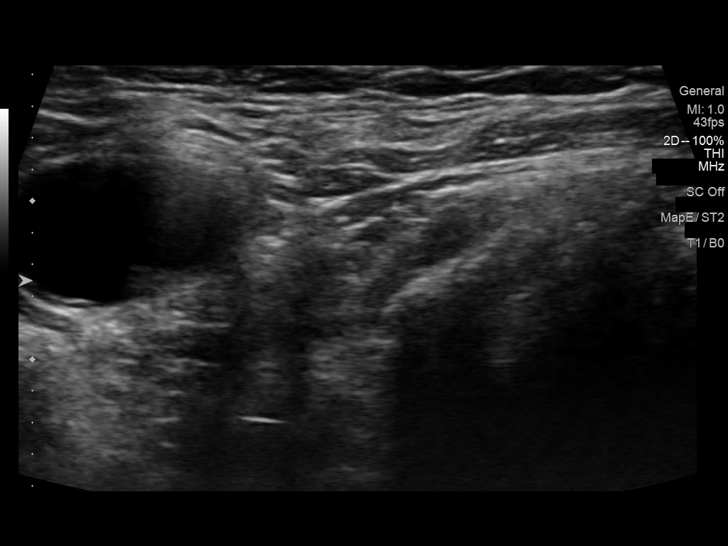
[im 44/44]
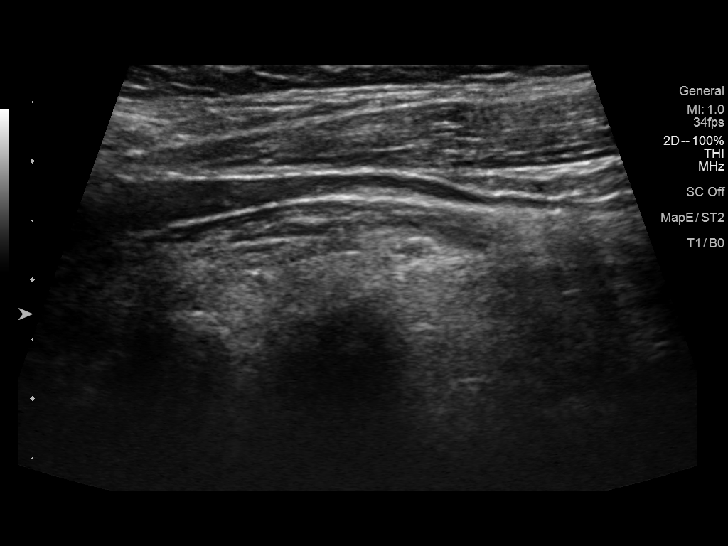

[13 of 25 positions shown; findings below may reference images not displayed]

FINDINGS: Parenchymal Echotexture: Mildly heterogenous

Isthmus: 0.4 cm

Right lobe: 5.8 x 1.6 x 1.8 cm

Left lobe: 4.0 x 1.5 x 1.6 cm

_________________________________________________________

Estimated total number of nodules >/= 1 cm: 2

Number of spongiform nodules >/=  2 cm not described below (TR1): 0

Number of mixed cystic and solid nodules >/= 1.5 cm not described
below (TR2): 0

_________________________________________________________

Nodule # 1: Pseudo nodule/lobulation posterior to the mid thyroid
gland. This is not felt to represent a true thyroid nodule.

_________________________________________________________

Nodule # 2:

Location: Right; Inferior

Maximum size: 1.9 cm; Other 2 dimensions: 1.6 x 1.6 cm

Composition: solid/almost completely solid (2)

Echogenicity: isoechoic (1)

Shape: not taller-than-wide (0)

Margins: ill-defined (0)

Echogenic foci: none (0)

ACR TI-RADS total points: 3.

ACR TI-RADS risk category: TR3 (3 points).

ACR TI-RADS recommendations:

*Given size (>/= 1.5 - 2.4 cm) and appearance, a follow-up
ultrasound in 1 year should be considered based on TI-RADS criteria.

_________________________________________________________
IMPRESSION: Approximately 1.9 cm TI-RADS category 3 (mildly suspicious) nodule
in the right inferior gland meets criteria for surveillance.
Recommend follow-up ultrasound in 1 year.

The above is in keeping with the ACR TI-RADS recommendations - [HOSPITAL] 3298;[DATE].

## 2020-08-17 ENCOUNTER — Telehealth: Payer: Self-pay | Admitting: Family Medicine

## 2020-08-17 NOTE — Telephone Encounter (Signed)
What is the name of the medication? metoprolol tartrate (LOPRESSOR) 25 MG tablet [[id number]] and losartan-hydrochlorothiazide (HYZAAR) 50-12.5 MG tablet [[id number]]     Have you contacted your pharmacy to request a refill? She is out of these meds and has an upcoming appointment with a different provider. I informed her we had many appointments available, she wanted a message put in.  Which pharmacy would you like this sent to? Pharmacy  [id number] [address], [place], [place] [address]  Phone:  [phone] Fax:  [phone]  DEA #:  [id number]      Patient notified that their request is being sent to the clinical staff for review and that they should receive a call once it is complete. If they do not receive a call within 72 hours they can check with their pharmacy or our office.

## 2020-08-18 ENCOUNTER — Other Ambulatory Visit: Payer: Self-pay

## 2020-08-18 MED ORDER — LOSARTAN POTASSIUM-HCTZ 50-12.5 MG PO TABS: 1.0000 | ORAL_TABLET | Freq: Every day | ORAL | 2 refills | Status: DC

## 2020-08-18 MED ORDER — METOPROLOL TARTRATE 25 MG PO TABS: 12.5000 mg | ORAL_TABLET | Freq: Every day | ORAL | 2 refills | Status: DC

## 2020-08-18 NOTE — Telephone Encounter (Signed)
Sent!

## 2020-09-14 ENCOUNTER — Telehealth: Payer: Self-pay

## 2020-09-14 NOTE — Telephone Encounter (Signed)
Patient has not been seen at this office, we can not send in medication for a patient that has not established care.

## 2020-09-14 NOTE — Telephone Encounter (Signed)
Scheduled pt for a TOC with Alyssa in August. Pt stated she will need her losartan and metoprolol refilled before then. Pt wanted to know if she needs an appointment before August for the refill or if Alyssa would be able to automatically send them in. Please advise.

## 2020-09-28 ENCOUNTER — Encounter: Payer: Medicare HMO | Admitting: Family Medicine

## 2020-10-12 ENCOUNTER — Ambulatory Visit (INDEPENDENT_AMBULATORY_CARE_PROVIDER_SITE_OTHER): Payer: Medicare HMO | Admitting: Endocrinology

## 2020-10-12 ENCOUNTER — Other Ambulatory Visit: Payer: Self-pay

## 2020-10-12 VITALS — BP 160/90 | HR 105 | Ht 66.0 in | Wt 230.8 lb

## 2020-10-12 DIAGNOSIS — E0590 Thyrotoxicosis, unspecified without thyrotoxic crisis or storm: Secondary | ICD-10-CM | POA: Diagnosis not present

## 2020-10-12 LAB — T4, FREE: Free T4: 0.95 ng/dL (ref 0.60–1.60)

## 2020-10-12 LAB — TSH: TSH: 3.17 u[IU]/mL (ref 0.35–4.50)

## 2020-10-12 NOTE — Patient Instructions (Signed)
Blood tests are requested for you today.  We'll let you know about the results.   If ever you have fever while taking methimazole, stop it and call us, even if the reason is obvious, because of the risk of a rare side-effect.   It is best to never miss the medication.  However, if you do miss it, next best is to double up the next time.  We'll plan to recheck the ultrasound next year.    Please come back for a follow-up appointment in 6 months.

## 2020-10-12 NOTE — Progress Notes (Signed)
   Subjective:    Patient ID: Misty Cabrera, female    DOB: November 23, 1950, 70 y.o.   MRN: [id number]  HPI Pt returns for f/u of hyperthyroidism (dx'ed mid-2018; [place] (2021) showed 1.9 cm RLP nodule--advise f/u 1 year; she declines RAI).  pt states she feels well in general. She takes tapazole as rx'ed.   Past Medical History:  Diagnosis Date  . Essential hypertension, benign 05/15/2019  . Hyperthyroidism   . Tachycardia 05/15/2019    Past Surgical History:  Procedure Laterality Date  . AUGMENTATION MAMMAPLASTY  1980  . TONSILLECTOMY      Social History   Socioeconomic History  . Marital status: Divorced    Spouse name: Not on file  . Number of children: Not on file  . Years of education: Not on file  . Highest education level: Not on file  Occupational History  . Not on file  Tobacco Use  . Smoking status: Never Smoker  . Smokeless tobacco: Never Used  Vaping Use  . Vaping Use: Never used  Substance and Sexual Activity  . Alcohol use: No  . Drug use: No  . Sexual activity: Not Currently    Birth control/protection: None  Other Topics Concern  . Not on file  Social History Narrative   Lives with son who is 56 and going to school for IT sales professional   Social Determinants of Health   Financial Resource Strain: Not on file  Food Insecurity: Not on file  Transportation Needs: Not on file  Physical Activity: Not on file  Stress: Not on file  Social Connections: Not on file  Intimate Partner Violence: Not on file    Current Outpatient Medications on File Prior to Visit  Medication Sig Dispense Refill  . losartan-hydrochlorothiazide (HYZAAR) 50-12.5 MG tablet Take 1 tablet by mouth daily. 90 tablet 2  . methimazole (TAPAZOLE) 5 MG tablet TAKE 1/2 TABLET BY MOUTH EVERY DAY 45 tablet 1  . metoprolol tartrate (LOPRESSOR) 25 MG tablet Take 0.5 tablets (12.5 mg total) by mouth daily. 45 tablet 2   No current facility-administered medications on file prior to visit.     No Known Allergies  Family History  Problem Relation Age of Onset  . Diabetes Maternal Aunt   . Diabetes Maternal Uncle   . Goiter Mother     BP (!) 160/90 (BP Location: Right Arm, Patient Position: Sitting, Cuff Size: Large)   Pulse (!) 105   Ht 5\' 6"  (1.676 m)   Wt 230 lb 12.8 oz (104.7 kg)   SpO2 97%   BMI 37.25 kg/m    Review of Systems Denies fever.      Objective:   Physical Exam VITAL SIGNS:  See vs page GENERAL: no distress NECK: There is no palpable thyroid enlarged on the right, but I cannot appreciate a specific nodule.  No thyroid nodule is palpable.  No palpable lymphadenopathy at the anterior neck.   Lab Results  Component Value Date   TSH 3.17 10/12/2020   T4TOTAL 7.4 05/15/2019       Assessment & Plan:  Thyroid nodule.  We discussed.  Pt declines f/u US this year.   Hyperthyroidism: well-controlled.  Please continue the same methimazole.

## 2020-10-31 ENCOUNTER — Other Ambulatory Visit: Payer: Self-pay | Admitting: Endocrinology

## 2021-01-18 ENCOUNTER — Ambulatory Visit (INDEPENDENT_AMBULATORY_CARE_PROVIDER_SITE_OTHER): Payer: Medicare HMO | Admitting: Physician Assistant

## 2021-01-18 ENCOUNTER — Other Ambulatory Visit: Payer: Self-pay

## 2021-01-18 ENCOUNTER — Encounter: Payer: Self-pay | Admitting: Physician Assistant

## 2021-01-18 VITALS — BP 128/85 | HR 98 | Temp 100.0°F | Ht 64.5 in | Wt 230.2 lb

## 2021-01-18 DIAGNOSIS — I10 Essential (primary) hypertension: Secondary | ICD-10-CM | POA: Diagnosis not present

## 2021-01-18 DIAGNOSIS — Z131 Encounter for screening for diabetes mellitus: Secondary | ICD-10-CM

## 2021-01-18 DIAGNOSIS — R000 Tachycardia, unspecified: Secondary | ICD-10-CM

## 2021-01-18 DIAGNOSIS — Z1231 Encounter for screening mammogram for malignant neoplasm of breast: Secondary | ICD-10-CM | POA: Diagnosis not present

## 2021-01-18 DIAGNOSIS — Z13220 Encounter for screening for lipoid disorders: Secondary | ICD-10-CM

## 2021-01-18 DIAGNOSIS — E0590 Thyrotoxicosis, unspecified without thyrotoxic crisis or storm: Secondary | ICD-10-CM | POA: Diagnosis not present

## 2021-01-18 LAB — CBC WITH DIFFERENTIAL/PLATELET
Basophils Absolute: 0 10*3/uL (ref 0.0–0.1)
Basophils Relative: 0.6 % (ref 0.0–3.0)
Eosinophils Absolute: 0.1 10*3/uL (ref 0.0–0.7)
Eosinophils Relative: 0.9 % (ref 0.0–5.0)
HCT: 41.9 % (ref 36.0–46.0)
Hemoglobin: 13.8 g/dL (ref 12.0–15.0)
Lymphocytes Relative: 33 % (ref 12.0–46.0)
Lymphs Abs: 2.2 10*3/uL (ref 0.7–4.0)
MCHC: 33 g/dL (ref 30.0–36.0)
MCV: 97 fl (ref 78.0–100.0)
Monocytes Absolute: 0.5 10*3/uL (ref 0.1–1.0)
Monocytes Relative: 7.1 % (ref 3.0–12.0)
Neutro Abs: 3.8 10*3/uL (ref 1.4–7.7)
Neutrophils Relative %: 58.4 % (ref 43.0–77.0)
Platelets: 258 10*3/uL (ref 150.0–400.0)
RBC: 4.32 Mil/uL (ref 3.87–5.11)
RDW: 12.9 % (ref 11.5–15.5)
WBC: 6.5 10*3/uL (ref 4.0–10.5)

## 2021-01-18 LAB — COMPREHENSIVE METABOLIC PANEL
ALT: 17 U/L (ref 0–35)
AST: 21 U/L (ref 0–37)
Albumin: 4.3 g/dL (ref 3.5–5.2)
Alkaline Phosphatase: 67 U/L (ref 39–117)
BUN: 18 mg/dL (ref 6–23)
CO2: 26 mEq/L (ref 19–32)
Calcium: 9.7 mg/dL (ref 8.4–10.5)
Chloride: 103 mEq/L (ref 96–112)
Creatinine, Ser: 0.96 mg/dL (ref 0.40–1.20)
GFR: 59.79 mL/min — ABNORMAL LOW (ref >60.00)
Glucose, Bld: 87 mg/dL (ref 70–99)
Potassium: 4 mEq/L (ref 3.5–5.1)
Sodium: 138 mEq/L (ref 135–145)
Total Bilirubin: 0.4 mg/dL (ref 0.2–1.2)
Total Protein: 7.6 g/dL (ref 6.0–8.3)

## 2021-01-18 LAB — LIPID PANEL
Cholesterol: 218 mg/dL — ABNORMAL HIGH (ref 0–200)
HDL: 65.3 mg/dL (ref >39.00)
LDL Cholesterol: 130 mg/dL — ABNORMAL HIGH (ref 0–99)
NonHDL: 152.77
Total CHOL/HDL Ratio: 3
Triglycerides: 113 mg/dL (ref 0.0–149.0)
VLDL: 22.6 mg/dL (ref 0.0–40.0)

## 2021-01-18 NOTE — Progress Notes (Signed)
Established Patient Office Visit  Subjective:  Patient ID: Misty Cabrera, female    DOB: 03/05/1951  Age: 70 y.o. MRN: [id number]  CC:  Chief Complaint  Patient presents with   Annual Exam   Referral    Cardiology    HPI [name] presents for transfer of care. No acute issues to discuss, but would like basic labs checked. Sees Dr. Loanne Drilling for hyperthyroidism twice per year.   Having some weight gain issues. Currently using [employer] products. She is going to schedule with [facility] soon as well. No exercise at all right now. 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   Hx of HTN and tachycardia, but stable on current regimen.  Past Medical History:  Diagnosis Date   Essential hypertension, benign 05/15/2019   Hyperthyroidism    Tachycardia 05/15/2019    Past Surgical History:  Procedure Laterality Date   AUGMENTATION MAMMAPLASTY  1980   TONSILLECTOMY      Family History  Problem Relation Age of Onset   Diabetes Maternal Aunt    Diabetes Maternal Uncle    Goiter Mother     Social History   Socioeconomic History   Marital status: Divorced    Spouse name: Not on file   Number of children: Not on file   Years of education: Not on file   Highest education level: Not on file  Occupational History   Not on file  Tobacco Use   Smoking status: Never   Smokeless tobacco: Never  Vaping Use   Vaping Use: Never used  Substance and Sexual Activity   Alcohol use: No   Drug use: No   Sexual activity: Not Currently    Birth control/protection: None  Other Topics Concern   Not on file  Social History Narrative   Lives with son who is 91 and going to school for IT sales professional   Social Determinants of Health   Financial Resource Strain: Not on file  Food Insecurity: Not on file  Transportation Needs: Not on file  Physical Activity: Not on file  Stress: Not on file  Social Connections: Not on file  Intimate Partner Violence: Not on  file    Outpatient Medications Prior to Visit  Medication Sig Dispense Refill   losartan-hydrochlorothiazide (HYZAAR) 50-12.5 MG tablet Take 1 tablet by mouth daily. 90 tablet 2   methimazole (TAPAZOLE) 5 MG tablet TAKE 1/2 TABLET BY MOUTH EVERY DAY 45 tablet 1   metoprolol tartrate (LOPRESSOR) 25 MG tablet Take 0.5 tablets (12.5 mg total) by mouth daily. 45 tablet 2   No facility-administered medications prior to visit.    No Known Allergies  ROS Review of Systems  All other systems reviewed and are negative.    Objective:    Physical Exam Vitals and nursing note reviewed.  Constitutional:      General: She is not in acute distress.    Appearance: Normal appearance. She is obese.  HENT:     Head: Normocephalic.     Right Ear: External ear normal.     Left Ear: External ear normal.     Nose: Nose normal.     Mouth/Throat:     Mouth: Mucous membranes are moist.  Eyes:     Extraocular Movements: Extraocular movements intact.     Conjunctiva/sclera: Conjunctivae normal.     Pupils: Pupils are equal, round, and reactive to light.  Cardiovascular:     Rate and Rhythm: Normal rate and  regular rhythm.     Pulses: Normal pulses.     Heart sounds: No murmur heard. Pulmonary:     Effort: Pulmonary effort is normal.     Breath sounds: Normal breath sounds.  Abdominal:     General: Abdomen is flat. Bowel sounds are normal.     Palpations: Abdomen is soft.     Tenderness: There is no abdominal tenderness.  Musculoskeletal:        General: Normal range of motion.     Cervical back: Normal range of motion.  Skin:    General: Skin is warm.  Neurological:     General: No focal deficit present.     Mental Status: She is alert and oriented to person, place, and time.     Gait: Gait normal.  Psychiatric:        Mood and Affect: Mood normal.        Behavior: Behavior normal.    BP 128/85   Pulse 98   Temp 100 F (37.8 C)   Ht 5' 4.5" (1.638 m)   Wt 230 lb 3.2 oz (104.4  kg)   SpO2 97%   BMI 38.90 kg/m  Wt Readings from Last 3 Encounters:  01/18/21 230 lb 3.2 oz (104.4 kg)  10/12/20 230 lb 12.8 oz (104.7 kg)  04/02/20 225 lb 6.4 oz (102.2 kg)     Health Maintenance Due  Topic Date Due   Hepatitis C Screening  Never done   TETANUS/TDAP  Never done   COLONOSCOPY (Pts 45-15yr Insurance coverage will need to be confirmed)  Never done   Zoster Vaccines- Shingrix (1 of 2) Never done   DEXA SCAN  Never done   MAMMOGRAM  07/11/2015   COVID-19 Vaccine (2 - Pfizer series) 04/29/2020   PNA vac Low Risk Adult (2 of 2 - PPSV23) 05/14/2020   INFLUENZA VACCINE  01/11/2021    There are no preventive care reminders to display for this patient.  Lab Results  Component Value Date   TSH 3.17 10/12/2020   Lab Results  Component Value Date   WBC 6.2 05/15/2019   HGB 14.0 05/15/2019   HCT 41.1 (A) 05/15/2019   MCV 95.1 05/15/2019   PLT 205 12/31/2016   Lab Results  Component Value Date   NA 140 05/15/2019   K 4.8 05/15/2019   CO2 22 05/15/2019   GLUCOSE 92 05/15/2019   BUN 28 (H) 05/15/2019   CREATININE 0.99 05/15/2019   BILITOT 0.4 05/15/2019   ALKPHOS 82 05/15/2019   AST 19 05/15/2019   ALT 14 05/15/2019   PROT 6.8 05/15/2019   ALBUMIN 4.3 05/15/2019   CALCIUM 9.0 05/15/2019   ANIONGAP 9 12/31/2016   Lab Results  Component Value Date   CHOL 266 (H) 05/15/2019   Lab Results  Component Value Date   HDL 75 05/15/2019   Lab Results  Component Value Date   LDLCALC 179 (H) 05/15/2019   Lab Results  Component Value Date   TRIG 76 05/15/2019   Lab Results  Component Value Date   CHOLHDL 3.5 05/15/2019   No results found for: HGBA1C    Assessment & Plan:   Problem List Items Addressed This Visit       Cardiovascular and Mediastinum   Essential hypertension, benign - Primary (Chronic)   Relevant Orders   CBC with Differential/Platelet     Endocrine   Hyperthyroidism (Chronic)     Other   Tachycardia (Chronic)   Relevant  Orders  CBC with Differential/Platelet   Other Visit Diagnoses     Encounter for screening mammogram for malignant neoplasm of breast       Relevant Orders   MM DIGITAL SCREENING BILATERAL   Diabetes mellitus screening       Relevant Orders   Comprehensive metabolic panel   Screening for cholesterol level       Relevant Orders   Lipid panel       No orders of the defined types were placed in this encounter.   Follow-up: Return in about 1 year (around 01/18/2022) for CPE & fasting labs .   1. Essential hypertension, benign 2. Tachycardia At goal Hyzaar 50 - 12.5 mg daily Metoprolol 12.5 mg daily Needs to work on increasing cardio activity  3. Hyperthyroidism Sees Dr. Noralee Space with Methimazole 5 mg daily  4. Encounter for screening mammogram for malignant neoplasm of breast Out of date. Sent referral for screening mammogram.  5. Diabetes mellitus screening 6. Screening for cholesterol level Several years since last labs checked. Will order these today and treat accordingly.    Anuja Manka M Janille Draughon, PA-C

## 2021-01-18 NOTE — Patient Instructions (Signed)
Good to meet you today! Please go to the lab for blood work and I will send results through [place]. Keep working on healthy lifestyle changes. Monitor BP at home. Goal BP is 130/80 or less.  Low salt diet. Drink plenty of water (80 ounces daily is goal).  See you back in 1 year, call sooner if any concerns.

## 2021-01-19 ENCOUNTER — Telehealth: Payer: Self-pay

## 2021-01-19 NOTE — Telephone Encounter (Signed)
Left detailed message on personal voicemail, you would have to check with your insurance to see if they cover any weight loss medications and if they do then schedule an appt with Alyssa to discuss. Any questions call office.

## 2021-01-19 NOTE — Telephone Encounter (Signed)
Patient called back about lab results

## 2021-01-19 NOTE — Telephone Encounter (Signed)
Pt called asking if her insurance would approve weightloss injections. She is curious about [place] and [id number] injections. She was also wondering if Alyssa would prescribe them.

## 2021-01-19 NOTE — Telephone Encounter (Signed)
Patient is returning a call from [facility] about lab results.

## 2021-01-21 ENCOUNTER — Encounter: Payer: Self-pay | Admitting: Physician Assistant

## 2021-01-21 NOTE — Telephone Encounter (Signed)
Notified patient of information below she voices understanding, She is requesting information regarding coverage,also requesting a email with information. Not able to send email, I will send information via mail.

## 2021-01-21 NOTE — Telephone Encounter (Signed)
Patient returned call

## 2021-01-21 NOTE — Telephone Encounter (Signed)
Left voice message for patient to call clinic.  

## 2021-02-01 DIAGNOSIS — H52223 Regular astigmatism, bilateral: Secondary | ICD-10-CM | POA: Diagnosis not present

## 2021-02-01 DIAGNOSIS — H25813 Combined forms of age-related cataract, bilateral: Secondary | ICD-10-CM | POA: Diagnosis not present

## 2021-02-01 DIAGNOSIS — H524 Presbyopia: Secondary | ICD-10-CM | POA: Diagnosis not present

## 2021-02-01 DIAGNOSIS — H25043 Posterior subcapsular polar age-related cataract, bilateral: Secondary | ICD-10-CM | POA: Diagnosis not present

## 2021-02-01 DIAGNOSIS — H5203 Hypermetropia, bilateral: Secondary | ICD-10-CM | POA: Diagnosis not present

## 2021-04-06 DIAGNOSIS — H25043 Posterior subcapsular polar age-related cataract, bilateral: Secondary | ICD-10-CM | POA: Diagnosis not present

## 2021-04-06 DIAGNOSIS — H25013 Cortical age-related cataract, bilateral: Secondary | ICD-10-CM | POA: Diagnosis not present

## 2021-04-06 DIAGNOSIS — H2512 Age-related nuclear cataract, left eye: Secondary | ICD-10-CM | POA: Diagnosis not present

## 2021-04-06 DIAGNOSIS — H18413 Arcus senilis, bilateral: Secondary | ICD-10-CM | POA: Diagnosis not present

## 2021-04-06 DIAGNOSIS — H2513 Age-related nuclear cataract, bilateral: Secondary | ICD-10-CM | POA: Diagnosis not present

## 2021-05-02 ENCOUNTER — Other Ambulatory Visit: Payer: Self-pay | Admitting: Endocrinology

## 2021-05-17 ENCOUNTER — Other Ambulatory Visit: Payer: Self-pay

## 2021-05-17 ENCOUNTER — Ambulatory Visit (INDEPENDENT_AMBULATORY_CARE_PROVIDER_SITE_OTHER): Payer: Medicare HMO | Admitting: Endocrinology

## 2021-05-17 VITALS — BP 160/90 | HR 62 | Ht 64.5 in | Wt 228.4 lb

## 2021-05-17 DIAGNOSIS — E042 Nontoxic multinodular goiter: Secondary | ICD-10-CM | POA: Insufficient documentation

## 2021-05-17 DIAGNOSIS — Z23 Encounter for immunization: Secondary | ICD-10-CM | POA: Diagnosis not present

## 2021-05-17 DIAGNOSIS — E0590 Thyrotoxicosis, unspecified without thyrotoxic crisis or storm: Secondary | ICD-10-CM

## 2021-05-17 LAB — TSH: TSH: 1.95 u[IU]/mL (ref 0.35–5.50)

## 2021-05-17 LAB — T4, FREE: Free T4: 0.95 ng/dL (ref 0.60–1.60)

## 2021-05-17 NOTE — Progress Notes (Signed)
   Subjective:    Patient ID: Misty Cabrera, female    DOB: 1951/05/22, 70 y.o.   MRN: [id number]  HPI Pt returns for f/u of hyperthyroidism (dx'ed mid-2018; [place] (2021) showed 1.9 cm RLP nodule--advise f/u 1 year; she declines RAI).  pt states she feels well in general. She takes tapazole as rx'ed. Past Medical History:  Diagnosis Date   Essential hypertension, benign 05/15/2019   Hyperthyroidism    Tachycardia 05/15/2019    Past Surgical History:  Procedure Laterality Date   AUGMENTATION MAMMAPLASTY  1980   TONSILLECTOMY      Social History   Socioeconomic History   Marital status: Divorced    Spouse name: Not on file   Number of children: Not on file   Years of education: Not on file   Highest education level: Not on file  Occupational History   Not on file  Tobacco Use   Smoking status: Never   Smokeless tobacco: Never  Vaping Use   Vaping Use: Never used  Substance and Sexual Activity   Alcohol use: No   Drug use: No   Sexual activity: Not Currently    Birth control/protection: None  Other Topics Concern   Not on file  Social History Narrative   Lives with son who is 87 and going to school for IT sales professional   Social Determinants of Health   Financial Resource Strain: Not on file  Food Insecurity: Not on file  Transportation Needs: Not on file  Physical Activity: Not on file  Stress: Not on file  Social Connections: Not on file  Intimate Partner Violence: Not on file    Current Outpatient Medications on File Prior to Visit  Medication Sig Dispense Refill   losartan-hydrochlorothiazide (HYZAAR) 50-12.5 MG tablet Take 1 tablet by mouth daily. 90 tablet 2   methimazole (TAPAZOLE) 5 MG tablet TAKE 1/2 TABLET BY MOUTH EVERY DAY 45 tablet 1   metoprolol tartrate (LOPRESSOR) 25 MG tablet Take 0.5 tablets (12.5 mg total) by mouth daily. 45 tablet 2   No current facility-administered medications on file prior to visit.    No Known Allergies  Family  History  Problem Relation Age of Onset   Diabetes Maternal Aunt    Diabetes Maternal Uncle    Goiter Mother     BP (!) 160/90   Pulse 62   Ht 5' 4.5" (1.638 m)   Wt 228 lb 6.4 oz (103.6 kg)   SpO2 91%   BMI 38.60 kg/m    Review of Systems Denies fever    Objective:   Physical Exam VITAL SIGNS:  See vs page GENERAL: no distress NECK: There is no palpable thyroid enlargement.  No thyroid nodule is palpable.  No palpable lymphadenopathy at the anterior neck.    Lab Results  Component Value Date   TSH 1.95 05/17/2021   T4TOTAL 7.4 05/15/2019      Assessment & Plan:  Hyperthyroidism: well-controlled.  Please continue the same synthroid.

## 2021-05-17 NOTE — Patient Instructions (Addendum)
Your blood pressure is high today.  Please see your primary care provider soon, to have it rechecked Blood tests are requested for you today.  We'll let you know about the results.   If ever you have fever while taking methimazole, stop it and call us, even if the reason is obvious, because of the risk of a rare side-effect.   It is best to never miss the medication.  However, if you do miss it, next best is to double up the next time.  Let's recheck the ultrasound.  you will receive a phone call, about a day and time for an appointment.   Please come back for a follow-up appointment in 6 months.

## 2021-05-28 DIAGNOSIS — H2512 Age-related nuclear cataract, left eye: Secondary | ICD-10-CM | POA: Diagnosis not present

## 2021-05-28 DIAGNOSIS — H2511 Age-related nuclear cataract, right eye: Secondary | ICD-10-CM | POA: Diagnosis not present

## 2021-05-31 ENCOUNTER — Telehealth: Payer: Self-pay | Admitting: Physician Assistant

## 2021-05-31 NOTE — Telephone Encounter (Signed)
Copied from [place] [id number]. Topic: Medicare AWV >> May 31, 2021  9:52 AM Harris-Coley, Hannah Beat wrote: Reason for CRM: Attempted to schedule AWV. Unable to LVM.  Will try at later time.  Called patient to schedule Annual Wellness Visit.  Please schedule with Nurse Health Advisor Charlott Rakes, RN at [facility].  Please call [phone] ask for [facility]

## 2021-06-12 ENCOUNTER — Other Ambulatory Visit: Payer: Self-pay | Admitting: Registered Nurse

## 2021-06-15 ENCOUNTER — Other Ambulatory Visit: Payer: Self-pay

## 2021-06-15 ENCOUNTER — Telehealth: Payer: Self-pay

## 2021-06-15 MED ORDER — LOSARTAN POTASSIUM-HCTZ 50-12.5 MG PO TABS: 1.0000 | ORAL_TABLET | Freq: Every day | ORAL | 2 refills | Status: DC

## 2021-06-15 MED ORDER — METOPROLOL TARTRATE 25 MG PO TABS: 12.5000 mg | ORAL_TABLET | Freq: Every day | ORAL | 2 refills | Status: DC

## 2021-06-15 NOTE — Telephone Encounter (Signed)
Rx sent in

## 2021-06-15 NOTE — Telephone Encounter (Signed)
MEDICATION: losartan-hydrochlorothiazide (HYZAAR) 50-12.5 MG tablet  PHARMACY:  [id number] [address], [place] [address] Phone:  [phone]  Fax:  [phone]      Comments:   **Let patient know to contact pharmacy at the end of the day to make sure medication is ready. **  ** Please notify patient to allow 48-72 hours to process**  **Encourage patient to contact the pharmacy for refills or they can request refills through [facility]**

## 2021-06-18 DIAGNOSIS — H2511 Age-related nuclear cataract, right eye: Secondary | ICD-10-CM | POA: Diagnosis not present

## 2021-09-01 DIAGNOSIS — D225 Melanocytic nevi of trunk: Secondary | ICD-10-CM | POA: Diagnosis not present

## 2021-09-01 DIAGNOSIS — L578 Other skin changes due to chronic exposure to nonionizing radiation: Secondary | ICD-10-CM | POA: Diagnosis not present

## 2021-09-01 DIAGNOSIS — L820 Inflamed seborrheic keratosis: Secondary | ICD-10-CM | POA: Diagnosis not present

## 2021-09-01 DIAGNOSIS — L570 Actinic keratosis: Secondary | ICD-10-CM | POA: Diagnosis not present

## 2021-09-01 DIAGNOSIS — X32XXXA Exposure to sunlight, initial encounter: Secondary | ICD-10-CM | POA: Diagnosis not present

## 2021-09-08 ENCOUNTER — Ambulatory Visit
Admission: RE | Admit: 2021-09-08 | Discharge: 2021-09-08 | Disposition: A | Discharge status: Home / Self Care | Payer: Medicare HMO | Source: Ambulatory Visit | Attending: Endocrinology | Admitting: Endocrinology

## 2021-09-08 ENCOUNTER — Other Ambulatory Visit: Payer: Self-pay | Admitting: Geriatric Medicine

## 2021-09-08 DIAGNOSIS — E041 Nontoxic single thyroid nodule: Secondary | ICD-10-CM | POA: Diagnosis not present

## 2021-09-08 DIAGNOSIS — E042 Nontoxic multinodular goiter: Secondary | ICD-10-CM

## 2021-09-30 ENCOUNTER — Encounter: Payer: Self-pay | Admitting: Endocrinology

## 2021-09-30 ENCOUNTER — Ambulatory Visit (INDEPENDENT_AMBULATORY_CARE_PROVIDER_SITE_OTHER): Payer: Medicare HMO | Admitting: Endocrinology

## 2021-09-30 VITALS — BP 142/98 | HR 80 | Ht 64.5 in | Wt 232.8 lb

## 2021-09-30 DIAGNOSIS — E0590 Thyrotoxicosis, unspecified without thyrotoxic crisis or storm: Secondary | ICD-10-CM | POA: Diagnosis not present

## 2021-09-30 LAB — TSH: TSH: 2.57 u[IU]/mL (ref 0.35–5.50)

## 2021-09-30 LAB — T4, FREE: Free T4: 0.86 ng/dL (ref 0.60–1.60)

## 2021-09-30 NOTE — Patient Instructions (Addendum)
Your blood pressure is high today.  Please see your primary care provider soon, to have it rechecked ?Blood tests are requested for you today.  We'll let you know about the results.   ?If ever you have fever while taking methimazole, stop it and call us, even if the reason is obvious, because of the risk of a rare side-effect.   ?It is best to never miss the medication.  However, if you do miss it, next best is to double up the next time.  ?You should have the ultrasound rechecked in 1 more year.   ?You should have an endocrinology follow-up appointment in 6 months ?

## 2021-09-30 NOTE — Progress Notes (Signed)
? ?  Subjective:  ? ? Patient ID: Misty Cabrera, female    DOB: Feb 02, 1951, 71 y.o.   MRN: [id number] ? ?HPI ?Pt returns for f/u of hyperthyroidism (dx'ed mid-2018; [place] (2023) showed RLP nodule--advise f/u 1 year; she declines RAI).  pt states she feels well in general. She takes tapazole as rx'ed.   ?Past Medical History:  ?Diagnosis Date  ? Essential hypertension, benign 05/15/2019  ? Hyperthyroidism   ? Tachycardia 05/15/2019  ? ? ?Past Surgical History:  ?Procedure Laterality Date  ? [place]  ? TONSILLECTOMY    ? ? ?Social History  ? ?Socioeconomic History  ? Marital status: Divorced  ?  Spouse name: Not on file  ? Number of children: Not on file  ? Years of education: Not on file  ? Highest education level: Not on file  ?Occupational History  ? Not on file  ?Tobacco Use  ? Smoking status: Never  ? Smokeless tobacco: Never  ?Vaping Use  ? Vaping Use: Never used  ?Substance and Sexual Activity  ? Alcohol use: No  ? Drug use: No  ? Sexual activity: Not Currently  ?  Birth control/protection: None  ?Other Topics Concern  ? Not on file  ?Social History Narrative  ? Lives with son who is 25 and going to school for IT sales professional  ? ?Social Determinants of Health  ? ?Financial Resource Strain: Not on file  ?Food Insecurity: Not on file  ?Transportation Needs: Not on file  ?Physical Activity: Not on file  ?Stress: Not on file  ?Social Connections: Not on file  ?Intimate Partner Violence: Not on file  ? ? ?Current Outpatient Medications on File Prior to Visit  ?Medication Sig Dispense Refill  ? losartan-hydrochlorothiazide (HYZAAR) 50-12.5 MG tablet Take 1 tablet by mouth daily. 90 tablet 2  ? methimazole (TAPAZOLE) 5 MG tablet TAKE 1/2 TABLET BY MOUTH EVERY DAY 45 tablet 1  ? ?No current facility-administered medications on file prior to visit.  ? ? ?No Known Allergies ? ?Family History  ?Problem Relation Age of Onset  ? Diabetes Maternal Aunt   ? Diabetes Maternal Uncle   ? Goiter Mother    ? ? ?BP (!) 142/98 (BP Location: Left Arm, Patient Position: Sitting, Cuff Size: Normal)   Pulse 80   Ht 5' 4.5" (1.638 m)   Wt 232 lb 12.8 oz (105.6 kg)   SpO2 94%   BMI 39.34 kg/m?  ? ? ?Review of Systems ?Denies fever ?   ?Objective:  ? Physical Exam ?VITAL SIGNS:  See vs page ?GENERAL: no distress ?NECK: There is no palpable thyroid enlargement.  No thyroid nodule is palpable.  No palpable lymphadenopathy at the anterior neck.   ? ? ?Lab Results  ?Component Value Date  ? TSH 2.57 09/30/2021  ? T4TOTAL 7.4 05/15/2019  ? ?   ?Assessment & Plan:  ?Hyperthyroidism: well-controlled.  Please continue the same methimazole.   ? ?

## 2021-10-28 ENCOUNTER — Other Ambulatory Visit: Payer: Self-pay | Admitting: Endocrinology

## 2021-10-28 ENCOUNTER — Telehealth: Payer: Self-pay | Admitting: Physician Assistant

## 2021-10-28 NOTE — Telephone Encounter (Signed)
Copied from [place] [id number]. Topic: Medicare AWV >> Oct 28, 2021  9:39 AM Harris-Coley, Hannah Beat wrote: Reason for CRM: Left message for patient to schedule Annual Wellness Visit.  Please schedule with Nurse Health Advisor Charlott Rakes, RN at [facility].  Please call [phone] ask for [facility]

## 2021-11-05 ENCOUNTER — Ambulatory Visit (INDEPENDENT_AMBULATORY_CARE_PROVIDER_SITE_OTHER): Payer: Medicare HMO

## 2021-11-05 DIAGNOSIS — Z0000 Encounter for general adult medical examination without abnormal findings: Secondary | ICD-10-CM

## 2021-11-05 NOTE — Patient Instructions (Signed)
Misty Cabrera , Thank you for taking time to come for your Medicare Wellness Visit. I appreciate your ongoing commitment to your health goals. Please review the following plan we discussed and let me know if I can assist you in the future.   Screening recommendations/referrals: Colonoscopy: cologuard completed 12/31/19  Mammogram: declined at this time Bone Density: Declined at this time Recommended yearly ophthalmology/optometry visit for glaucoma screening and checkup Recommended yearly dental visit for hygiene and checkup  Vaccinations: Influenza vaccine: Done 05/17/21 repeat every year  Pneumococcal vaccine: Due and discussed  Tdap vaccine: Due and discussed  Shingles vaccine: Shingrix discussed. Please contact your pharmacy for coverage information.    Covid-19:done 04/08/20   Advanced directives: Please bring a copy of your health care power of attorney and living will to the office at your convenience.  Conditions/risks identified: Lose weight   Next appointment: Follow up in one year for your annual wellness visit    Preventive Care 65 Years and Older, Female Preventive care refers to lifestyle choices and visits with your health care provider that can promote health and wellness. What does preventive care include? A yearly physical exam. This is also called an annual well check. Dental exams once or twice a year. Routine eye exams. Ask your health care provider how often you should have your eyes checked. Personal lifestyle choices, including: Daily care of your teeth and gums. Regular physical activity. Eating a healthy diet. Avoiding tobacco and drug use. Limiting alcohol use. Practicing safe sex. Taking low-dose aspirin every day. Taking vitamin and mineral supplements as recommended by your health care provider. What happens during an annual well check? The services and screenings done by your health care provider during your annual well check will depend on your age,  overall health, lifestyle risk factors, and family history of disease. Counseling  Your health care provider may ask you questions about your: Alcohol use. Tobacco use. Drug use. Emotional well-being. Home and relationship well-being. Sexual activity. Eating habits. History of falls. Memory and ability to understand (cognition). Work and work Statistician. Reproductive health. Screening  You may have the following tests or measurements: Height, weight, and BMI. Blood pressure. Lipid and cholesterol levels. These may be checked every 5 years, or more frequently if you are over 57 years old. Skin check. Lung cancer screening. You may have this screening every year starting at age 80 if you have a 30-pack-year history of smoking and currently smoke or have quit within the past 15 years. Fecal occult blood test (FOBT) of the stool. You may have this test every year starting at age 73. Flexible sigmoidoscopy or colonoscopy. You may have a sigmoidoscopy every 5 years or a colonoscopy every 10 years starting at age 66. Hepatitis C blood test. Hepatitis B blood test. Sexually transmitted disease (STD) testing. Diabetes screening. This is done by checking your blood sugar (glucose) after you have not eaten for a while (fasting). You may have this done every 1-3 years. Bone density scan. This is done to screen for osteoporosis. You may have this done starting at age 21. Mammogram. This may be done every 1-2 years. Talk to your health care provider about how often you should have regular mammograms. Talk with your health care provider about your test results, treatment options, and if necessary, the need for more tests. Vaccines  Your health care provider may recommend certain vaccines, such as: Influenza vaccine. This is recommended every year. Tetanus, diphtheria, and acellular pertussis (Tdap, Td) vaccine. You  may need a Td booster every 10 years. Zoster vaccine. You may need this after age  30. Pneumococcal 13-valent conjugate (PCV13) vaccine. One dose is recommended after age 85. Pneumococcal polysaccharide (PPSV23) vaccine. One dose is recommended after age 79. Talk to your health care provider about which screenings and vaccines you need and how often you need them. This information is not intended to replace advice given to you by your health care provider. Make sure you discuss any questions you have with your health care provider. Document Released: 06/26/2015 Document Revised: 02/17/2016 Document Reviewed: 03/31/2015 Elsevier Interactive Patient Education  2017 [place] Prevention in the Home Falls can cause injuries. They can happen to people of all ages. There are many things you can do to make your home safe and to help prevent falls. What can I do on the outside of my home? Regularly fix the edges of walkways and driveways and fix any cracks. Remove anything that might make you trip as you walk through a door, such as a raised step or threshold. Trim any bushes or trees on the path to your home. Use bright outdoor lighting. Clear any walking paths of anything that might make someone trip, such as rocks or tools. Regularly check to see if handrails are loose or broken. Make sure that both sides of any steps have handrails. Any raised decks and porches should have guardrails on the edges. Have any leaves, snow, or ice cleared regularly. Use sand or salt on walking paths during winter. Clean up any spills in your garage right away. This includes oil or grease spills. What can I do in the bathroom? Use night lights. Install grab bars by the toilet and in the tub and shower. Do not use towel bars as grab bars. Use non-skid mats or decals in the tub or shower. If you need to sit down in the shower, use a plastic, non-slip stool. Keep the floor dry. Clean up any water that spills on the floor as soon as it happens. Remove soap buildup in the tub or shower  regularly. Attach bath mats securely with double-sided non-slip rug tape. Do not have throw rugs and other things on the floor that can make you trip. What can I do in the bedroom? Use night lights. Make sure that you have a light by your bed that is easy to reach. Do not use any sheets or blankets that are too big for your bed. They should not hang down onto the floor. Have a firm chair that has side arms. You can use this for support while you get dressed. Do not have throw rugs and other things on the floor that can make you trip. What can I do in the kitchen? Clean up any spills right away. Avoid walking on wet floors. Keep items that you use a lot in easy-to-reach places. If you need to reach something above you, use a strong step stool that has a grab bar. Keep electrical cords out of the way. Do not use floor polish or wax that makes floors slippery. If you must use wax, use non-skid floor wax. Do not have throw rugs and other things on the floor that can make you trip. What can I do with my stairs? Do not leave any items on the stairs. Make sure that there are handrails on both sides of the stairs and use them. Fix handrails that are broken or loose. Make sure that handrails are as long as the  stairways. Check any carpeting to make sure that it is firmly attached to the stairs. Fix any carpet that is loose or worn. Avoid having throw rugs at the top or bottom of the stairs. If you do have throw rugs, attach them to the floor with carpet tape. Make sure that you have a light switch at the top of the stairs and the bottom of the stairs. If you do not have them, ask someone to add them for you. What else can I do to help prevent falls? Wear shoes that: Do not have high heels. Have rubber bottoms. Are comfortable and fit you well. Are closed at the toe. Do not wear sandals. If you use a stepladder: Make sure that it is fully opened. Do not climb a closed stepladder. Make sure that  both sides of the stepladder are locked into place. Ask someone to hold it for you, if possible. Clearly mark and make sure that you can see: Any grab bars or handrails. First and last steps. Where the edge of each step is. Use tools that help you move around (mobility aids) if they are needed. These include: Canes. Walkers. Scooters. Crutches. Turn on the lights when you go into a dark area. Replace any light bulbs as soon as they burn out. Set up your furniture so you have a clear path. Avoid moving your furniture around. If any of your floors are uneven, fix them. If there are any pets around you, be aware of where they are. Review your medicines with your doctor. Some medicines can make you feel dizzy. This can increase your chance of falling. Ask your doctor what other things that you can do to help prevent falls. This information is not intended to replace advice given to you by your health care provider. Make sure you discuss any questions you have with your health care provider. Document Released: 03/26/2009 Document Revised: 11/05/2015 Document Reviewed: 07/04/2014 Elsevier Interactive Patient Education  2017 [employer].

## 2021-11-05 NOTE — Progress Notes (Addendum)
Virtual Visit via Telephone Note  I connected with  Misty Cabrera on 11/05/21 at  9:30 AM EDT by telephone and verified that I am speaking with the correct person using two identifiers.  Medicare Annual Wellness visit completed telephonically due to Covid-19 pandemic.   Persons participating in this call: This Health Coach and this patient.   Location: Patient: Home Provider: Office   I discussed the limitations, risks, security and privacy concerns of performing an evaluation and management service by telephone and the availability of in person appointments. The patient expressed understanding and agreed to proceed.  Unable to perform video visit due to video visit attempted and failed and/or patient does not have video capability.   Some vital signs may be absent or patient reported.   Willette Brace, LPN  Subjective:   Misty Cabrera is a 71 y.o. female who presents for an Initial Medicare Annual Wellness Visit.  Review of Systems     Cardiac Risk Factors include: advanced age (>49mn, >>84women);obesity (BMI >30kg/m2);hypertension     Objective:    There were no vitals filed for this visit. There is no height or weight on file to calculate BMI.     11/05/2021    9:17 AM 12/31/2016    4:25 PM  Advanced Directives  Does Patient Have a Medical Advance Directive? Yes No  Type of Advance Directive H[place] Chart? No - copy requested   Would patient like information on creating a medical advance directive?  No - Patient declined    Current Medications (verified) Outpatient Encounter Medications as of 11/05/2021  Medication Sig   losartan-hydrochlorothiazide (HYZAAR) 50-12.5 MG tablet Take 1 tablet by mouth daily.   methimazole (TAPAZOLE) 5 MG tablet TAKE 1/2 TABLET BY MOUTH EVERY DAY   No facility-administered encounter medications on file as of 11/05/2021.    Allergies (verified) Patient has no known  allergies.   History: Past Medical History:  Diagnosis Date   Essential hypertension, benign 05/15/2019   Hyperthyroidism    Tachycardia 05/15/2019   Past Surgical History:  Procedure Laterality Date   AUGMENTATION MAMMAPLASTY  1980   TONSILLECTOMY     Family History  Problem Relation Age of Onset   Diabetes Maternal Aunt    Diabetes Maternal Uncle    Goiter Mother    Social History   Socioeconomic History   Marital status: Divorced    Spouse name: Not on file   Number of children: Not on file   Years of education: Not on file   Highest education level: Not on file  Occupational History   Not on file  Tobacco Use   Smoking status: Never   Smokeless tobacco: Never  Vaping Use   Vaping Use: Never used  Substance and Sexual Activity   Alcohol use: No   Drug use: No   Sexual activity: Not Currently    Birth control/protection: None  Other Topics Concern   Not on file  Social History Narrative   Lives with son who is 542and going to school for cIT sales professional  Social Determinants of Health   Financial Resource Strain: Low Risk    Difficulty of Paying Living Expenses: Not hard at all  Food Insecurity: No Food Insecurity   Worried About RCharity fundraiserin the Last Year: Never true   [place]in the Last Year: Never true  Transportation Needs: No Transportation Needs  Lack of Transportation (Medical): No   Lack of Transportation (Non-Medical): No  Physical Activity: Inactive   Days of Exercise per Week: 0 days   Minutes of Exercise per Session: 0 min  Stress: No Stress Concern Present   Feeling of Stress : Not at all  Social Connections: Socially Isolated   Frequency of Communication with Friends and Family: More than three times a week   Frequency of Social Gatherings with Friends and Family: More than three times a week   Attends Religious Services: Never   Marine scientist or Organizations: No   Attends Music therapist:  Never   Marital Status: Divorced    Tobacco Counseling Counseling given: Not Answered   Clinical Intake:  Pre-visit preparation completed: Yes  Pain : No/denies pain     BMI - recorded: 39.36 Nutritional Status: BMI > 30  Obese Nutritional Risks: None Diabetes: No  How often do you need to have someone help you when you read instructions, pamphlets, or other written materials from your doctor or pharmacy?: 1 - Never  Diabetic?no  Interpreter Needed?: No  Information entered by :: Charlott Rakes, LPN   Activities of Daily Living    11/05/2021    9:19 AM  In your present state of health, do you have any difficulty performing the following activities:  Hearing? 0  Vision? 0  Difficulty concentrating or making decisions? 0  Walking or climbing stairs? 0  Dressing or bathing? 0  Doing errands, shopping? 0  Preparing Food and eating ? N  Using the Toilet? N  In the past six months, have you accidently leaked urine? N  Comment urgency  Do you have problems with loss of bowel control? N  Managing your Medications? N  Managing your Finances? N  Housekeeping or managing your Housekeeping? N    Patient Care Team: Allwardt, Randa Evens, PA-C as PCP - General (Physician Assistant)  Indicate any recent Medical Services you may have received from other than [facility] providers in the past year (date may be approximate).     Assessment:   This is a routine wellness examination for [place].  Hearing/Vision screen Hearing Screening - Comments:: Pt denies any hearing issues  Vision Screening - Comments:: Pt follows up with Dr Dorcas Carrow  for annual eye exams   Dietary issues and exercise activities discussed: Current Exercise Habits: The patient does not participate in regular exercise at present   Goals Addressed             This Visit's Progress    Patient Stated       Lose weight        Depression Screen    11/05/2021    9:12 AM 01/18/2021   12:58 PM 12/31/2019    10:57 AM 05/15/2019    8:54 AM 05/15/2019    8:07 AM 07/25/2017    8:04 AM 02/23/2017    2:15 PM  PHQ 2/9 Scores  PHQ - 2 Score 1 0 0 0 0 0 0  PHQ- 9 Score 2          Fall Risk    11/05/2021    9:18 AM 01/18/2021   12:58 PM 12/31/2019   10:56 AM 05/15/2019    8:07 AM 07/25/2017    8:04 AM  [place] in the past year? 0 0 0 0 No  Number falls in past yr: 0  0 0   Injury with Fall? 0  0 0  Follow up Falls prevention discussed  Falls evaluation completed Falls evaluation completed     Delia:  Any stairs in or around the home? No  If so, are there any without handrails? No  Home free of loose throw rugs in walkways, pet beds, electrical cords, etc? Yes  Adequate lighting in your home to reduce risk of falls? Yes   ASSISTIVE DEVICES UTILIZED TO PREVENT FALLS:  Life alert? No  Use of a cane, walker or w/c? No  Grab bars in the bathroom? No  Shower chair or bench in shower? No  Elevated toilet seat or a handicapped toilet? No   TIMED UP AND GO:  Was the test performed? No .   Cognitive Function:        11/05/2021    9:20 AM  6CIT Screen  What Year? 0 points  What month? 0 points  What time? 0 points  Count back from 20 0 points  Months in reverse 0 points  Repeat phrase 0 points  Total Score 0 points    Immunizations Immunization History  Administered Date(s) Administered   Fluad Quad(high Dose 65+) 04/02/2020, 05/17/2021   Influenza,inj,Quad PF,6+ Mos 07/25/2017   PFIZER(Purple Top)SARS-COV-2 Vaccination 04/08/2020   Pneumococcal Conjugate-13 05/15/2019    TDAP status: Due, Education has been provided regarding the importance of this vaccine. Advised may receive this vaccine at local pharmacy or Health Dept. Aware to provide a copy of the vaccination record if obtained from local pharmacy or Health Dept. Verbalized acceptance and understanding.  Flu Vaccine status: Up to date  Pneumococcal vaccine status: Due,  Education has been provided regarding the importance of this vaccine. Advised may receive this vaccine at local pharmacy or Health Dept. Aware to provide a copy of the vaccination record if obtained from local pharmacy or Health Dept. Verbalized acceptance and understanding.  Covid-19 vaccine status: Completed vaccines  Qualifies for Shingles Vaccine? Yes   Zostavax completed No   Shingrix Completed?: No.    Education has been provided regarding the importance of this vaccine. Patient has been advised to call insurance company to determine out of pocket expense if they have not yet received this vaccine. Advised may also receive vaccine at local pharmacy or Health Dept. Verbalized acceptance and understanding.  Screening Tests Health Maintenance  Topic Date Due   Hepatitis C Screening  Never done   TETANUS/TDAP  Never done   COLONOSCOPY (Pts 45-79yr Insurance coverage will need to be confirmed)  Never done   Zoster Vaccines- Shingrix (1 of 2) Never done   DEXA SCAN  Never done   MAMMOGRAM  07/11/2015   COVID-19 Vaccine (2 - Pfizer series) 04/29/2020   Pneumonia Vaccine 72 Years old (2 - PPSV23 if available, else PCV20) 05/14/2020   INFLUENZA VACCINE  01/11/2022   HPV VJayuyaMaintenance  Health Maintenance Due  Topic Date Due   Hepatitis C Screening  Never done   TETANUS/TDAP  Never done   COLONOSCOPY (Pts 45-493yrInsurance coverage will need to be confirmed)  Never done   Zoster Vaccines- Shingrix (1 of 2) Never done   DEXA SCAN  Never done   MAMMOGRAM  07/11/2015   COVID-19 Vaccine (2 - Pfizer series) 04/29/2020   Pneumonia Vaccine 6534Years old (2 - PPSV23 if available, else PCV20) 05/14/2020    Colorectal cancer screening: Type of screening: Colonoscopy. Completed 12/31/19. Repeat every per PCP years  Mammogram status: Completed  07/10/13. Repeat every year, pt stated doesn't feel it's needed      Additional Screening:  Hepatitis C Screening:  does qualify;  Vision Screening: Recommended annual ophthalmology exams for early detection of glaucoma and other disorders of the eye. Is the patient up to date with their annual eye exam?  Yes  Who is the provider or what is the name of the office in which the patient attends annual eye exams? Dr Dorcas Carrow If pt is not established with a provider, would they like to be referred to a provider to establish care? No .   Dental Screening: Recommended annual dental exams for proper oral hygiene  Community Resource Referral / Chronic Care Management: CRR required this visit?  No   CCM required this visit?  No      Plan:     I have personally reviewed and noted the following in the patient's chart:   Medical and social history Use of alcohol, tobacco or illicit drugs  Current medications and supplements including opioid prescriptions. Patient is not currently taking opioid prescriptions. Functional ability and status Nutritional status Physical activity Advanced directives List of other physicians Hospitalizations, surgeries, and ER visits in previous 12 months Vitals Screenings to include cognitive, depression, and falls Referrals and appointments  In addition, I have reviewed and discussed with patient certain preventive protocols, quality metrics, and best practice recommendations. A written personalized care plan for preventive services as well as general preventive health recommendations were provided to patient.     Willette Brace, LPN   02/25/7828   Nurse Notes: None

## 2021-11-29 ENCOUNTER — Telehealth: Payer: Self-pay | Admitting: Physician Assistant

## 2021-11-29 ENCOUNTER — Encounter: Payer: Self-pay | Admitting: Physician Assistant

## 2021-11-29 NOTE — Telephone Encounter (Signed)
Patient requests a Letter stating Patient has a Doctor appointment on 01/19/22. Patient has possible jury duty the same day and Patient states a letter proving she has a Doctor appointment may relieve her of jury duty.

## 2021-11-29 NOTE — Telephone Encounter (Signed)
Called pt and advised letter created for her and also printed an appointment reminder for her as well. Pt asked if I could just mail to her and advised would put in outgoing mail and should go out this afternoon or tomorrow. Patient verbalized understanding

## 2021-11-29 NOTE — Telephone Encounter (Signed)
Pt has an appt for physical on 01/19/22, would you like me to provider letter for this appt

## 2021-12-17 ENCOUNTER — Ambulatory Visit: Payer: Medicare HMO | Admitting: Endocrinology

## 2022-01-19 ENCOUNTER — Ambulatory Visit (INDEPENDENT_AMBULATORY_CARE_PROVIDER_SITE_OTHER): Payer: Medicare HMO | Admitting: Physician Assistant

## 2022-01-19 ENCOUNTER — Encounter: Payer: Self-pay | Admitting: Physician Assistant

## 2022-01-19 VITALS — BP 124/84 | HR 80 | Temp 97.3°F | Ht 64.5 in | Wt 225.0 lb

## 2022-01-19 DIAGNOSIS — E782 Mixed hyperlipidemia: Secondary | ICD-10-CM | POA: Diagnosis not present

## 2022-01-19 DIAGNOSIS — R599 Enlarged lymph nodes, unspecified: Secondary | ICD-10-CM

## 2022-01-19 DIAGNOSIS — Z0000 Encounter for general adult medical examination without abnormal findings: Secondary | ICD-10-CM | POA: Diagnosis not present

## 2022-01-19 DIAGNOSIS — Z23 Encounter for immunization: Secondary | ICD-10-CM | POA: Diagnosis not present

## 2022-01-19 DIAGNOSIS — Z1159 Encounter for screening for other viral diseases: Secondary | ICD-10-CM | POA: Diagnosis not present

## 2022-01-19 LAB — CBC WITH DIFFERENTIAL/PLATELET
Basophils Absolute: 0 10*3/uL (ref 0.0–0.1)
Basophils Relative: 0.5 % (ref 0.0–3.0)
Eosinophils Absolute: 0.1 10*3/uL (ref 0.0–0.7)
Eosinophils Relative: 1.4 % (ref 0.0–5.0)
HCT: 44.3 % (ref 36.0–46.0)
Hemoglobin: 14.6 g/dL (ref 12.0–15.0)
Lymphocytes Relative: 40.2 % (ref 12.0–46.0)
Lymphs Abs: 2.1 10*3/uL (ref 0.7–4.0)
MCHC: 32.9 g/dL (ref 30.0–36.0)
MCV: 96.9 fl (ref 78.0–100.0)
Monocytes Absolute: 0.4 10*3/uL (ref 0.1–1.0)
Monocytes Relative: 7.1 % (ref 3.0–12.0)
Neutro Abs: 2.6 10*3/uL (ref 1.4–7.7)
Neutrophils Relative %: 50.8 % (ref 43.0–77.0)
Platelets: 237 10*3/uL (ref 150.0–400.0)
RBC: 4.58 Mil/uL (ref 3.87–5.11)
RDW: 13.1 % (ref 11.5–15.5)
WBC: 5.2 10*3/uL (ref 4.0–10.5)

## 2022-01-19 LAB — COMPREHENSIVE METABOLIC PANEL
ALT: 10 U/L (ref 0–35)
AST: 14 U/L (ref 0–37)
Albumin: 4.2 g/dL (ref 3.5–5.2)
Alkaline Phosphatase: 67 U/L (ref 39–117)
BUN: 17 mg/dL (ref 6–23)
CO2: 29 mEq/L (ref 19–32)
Calcium: 9.2 mg/dL (ref 8.4–10.5)
Chloride: 105 mEq/L (ref 96–112)
Creatinine, Ser: 0.89 mg/dL (ref 0.40–1.20)
GFR: 65.02 mL/min (ref >60.00)
Glucose, Bld: 89 mg/dL (ref 70–99)
Potassium: 4.5 mEq/L (ref 3.5–5.1)
Sodium: 140 mEq/L (ref 135–145)
Total Bilirubin: 0.5 mg/dL (ref 0.2–1.2)
Total Protein: 6.9 g/dL (ref 6.0–8.3)

## 2022-01-19 LAB — LIPID PANEL
Cholesterol: 242 mg/dL — ABNORMAL HIGH (ref 0–200)
HDL: 58.1 mg/dL (ref >39.00)
LDL Cholesterol: 166 mg/dL — ABNORMAL HIGH (ref 0–99)
NonHDL: 183.62
Total CHOL/HDL Ratio: 4
Triglycerides: 89 mg/dL (ref 0.0–149.0)
VLDL: 17.8 mg/dL (ref 0.0–40.0)

## 2022-01-19 NOTE — Progress Notes (Signed)
Subjective:    Patient ID: Misty Cabrera, female    DOB: Dec 21, 1950, 71 y.o.   MRN: [id number]  Chief Complaint  Patient presents with   Annual Exam    Pt in for annual CPE; pt is fasting for labs; pt c/o swollen lymph nodes; pt states Dr Loanne Drilling done the [place] but retired the day she was saw and didn't get any results from [place], he stated she needed to discuss with PCP. Pt wants to discuss weight loss; pt doesn't think she needs mammogram since she now has implants; pt also says she has Cologuard at home and needs to complete and send back in, not getting Colonoscopy;     HPI Patient is in today for annual exam.  Acute concerns: Enlarged lymph nodes along neck and above clavicle bilateral; noticed about 5 months ago. Non-tender. Seems to be getting larger as time goes on.   Health maintenance: Lifestyle/ exercise: Wanting to work on weight loss Mental health: Doing well  Sleep: Doing well  Substance use: None Immunizations: Due for next Shingrix  Colonoscopy: Needs to update - wants to wait  Mammogram: Needs to update - wants to wait DEXA: Needs to update - wants to wait      Past Medical History:  Diagnosis Date   Essential hypertension, benign 05/15/2019   Hyperthyroidism    Tachycardia 05/15/2019    Past Surgical History:  Procedure Laterality Date   AUGMENTATION MAMMAPLASTY  1980   TONSILLECTOMY      Family History  Problem Relation Age of Onset   Diabetes Maternal Aunt    Diabetes Maternal Uncle    Goiter Mother     Social History   Tobacco Use   Smoking status: Never   Smokeless tobacco: Never  Vaping Use   Vaping Use: Never used  Substance Use Topics   Alcohol use: No   Drug use: No     No Known Allergies  Review of Systems NEGATIVE UNLESS OTHERWISE INDICATED IN HPI      Objective:     BP 124/84 (BP Location: Left Arm)   Pulse 80   Temp (!) 97.3 F (36.3 C) (Temporal)   Ht 5' 4.5" (1.638 m)   Wt 225 lb (102.1 kg)   SpO2 98%   BMI 38.02  kg/m   Wt Readings from Last 3 Encounters:  01/19/22 225 lb (102.1 kg)  09/30/21 232 lb 12.8 oz (105.6 kg)  05/17/21 228 lb 6.4 oz (103.6 kg)    BP Readings from Last 3 Encounters:  01/19/22 124/84  09/30/21 (!) 142/98  05/17/21 (!) 160/90     Physical Exam Vitals and nursing note reviewed.  Constitutional:      Appearance: Normal appearance. She is obese. She is not toxic-appearing.  HENT:     Head: Normocephalic and atraumatic.     Right Ear: Tympanic membrane, ear canal and external ear normal.     Left Ear: Tympanic membrane, ear canal and external ear normal.     Nose: Nose normal.     Mouth/Throat:     Mouth: Mucous membranes are moist.  Eyes:     Extraocular Movements: Extraocular movements intact.     Conjunctiva/sclera: Conjunctivae normal.     Pupils: Pupils are equal, round, and reactive to light.  Cardiovascular:     Rate and Rhythm: Normal rate and regular rhythm.     Pulses: Normal pulses.     Heart sounds: Normal heart sounds.  Pulmonary:     Effort:  Pulmonary effort is normal.     Breath sounds: Normal breath sounds.  Abdominal:     General: Abdomen is flat. Bowel sounds are normal.     Palpations: Abdomen is soft.  Musculoskeletal:        General: Normal range of motion.     Cervical back: Normal range of motion and neck supple.  Lymphadenopathy:     Comments: No discrete lymph nodes enlarged, but patient does have noticeable bilateral "fullness" in supraclavicular regions   Skin:    General: Skin is warm and dry.  Neurological:     General: No focal deficit present.     Mental Status: She is alert and oriented to person, place, and time.  Psychiatric:        Mood and Affect: Mood normal.        Behavior: Behavior normal.        Thought Content: Thought content normal.        Judgment: Judgment normal.        Assessment & Plan:   Problem List Items Addressed This Visit   None Visit Diagnoses     Encounter for annual physical exam    -   Primary   Relevant Orders   CBC with Differential/Platelet (Completed)   Comprehensive metabolic panel (Completed)   Lipid panel (Completed)   Enlarged lymph nodes       Relevant Orders   US Soft Tissue Head/Neck (NON-THYROID) (Completed)   CBC with Differential/Platelet (Completed)   Mixed hyperlipidemia       Relevant Medications   metoprolol tartrate (LOPRESSOR) 25 MG tablet   Other Relevant Orders   Lipid panel (Completed)   Need for prophylactic vaccination against Streptococcus pneumoniae (pneumococcus)       Relevant Orders   Pneumococcal conjugate vaccine 20-valent (Prevnar 20) (Completed)   Need for hepatitis C screening test       Relevant Orders   Hepatitis C Antibody (Completed)       PLAN: Age-appropriate screening and counseling performed today. Will check labs and call with results. Preventive measures discussed and printed in AVS for patient.  -She wants to wait on screening items at this time until she has imaging done of soft-tissue swelling supraclavicularly  -Encouraged to work on lifestyle goals  Patient Counseling: '[x]'$   Nutrition: Stressed importance of moderation in sodium/caffeine intake, saturated fat and cholesterol, caloric balance, sufficient intake of fresh fruits, vegetables, and fiber.  '[x]'$   Stressed the importance of regular exercise.   '[]'$   Substance Abuse: Discussed cessation/primary prevention of tobacco, alcohol, or other drug use; driving or other dangerous activities under the influence; availability of treatment for abuse.   '[x]'$   Injury prevention: Discussed safety belts, safety helmets, smoke detector, smoking near bedding or upholstery.   '[]'$   Sexuality: Discussed sexually transmitted diseases, partner selection, use of condoms, avoidance of unintended pregnancy  and contraceptive alternatives.   '[x]'$   Dental health: Discussed importance of regular tooth brushing, flossing, and dental visits.  '[x]'$   Health maintenance and immunizations  reviewed. Please refer to Health maintenance section.      Return in about 1 year (around 01/20/2023) for CPE and fasting labs .   Kyi Romanello M Selenne Coggin, PA-C

## 2022-01-20 ENCOUNTER — Other Ambulatory Visit: Payer: Self-pay

## 2022-01-20 LAB — HEPATITIS C ANTIBODY: Hepatitis C Ab: NONREACTIVE

## 2022-01-20 MED ORDER — ATORVASTATIN CALCIUM 10 MG PO TABS: 10.0000 mg | ORAL_TABLET | Freq: Every evening | ORAL | 3 refills | Status: DC

## 2022-01-21 ENCOUNTER — Ambulatory Visit
Admission: RE | Admit: 2022-01-21 | Discharge: 2022-01-21 | Disposition: A | Discharge status: Home / Self Care | Payer: Medicare HMO | Source: Ambulatory Visit | Attending: Physician Assistant | Admitting: Physician Assistant

## 2022-01-21 DIAGNOSIS — R599 Enlarged lymph nodes, unspecified: Secondary | ICD-10-CM

## 2022-01-21 DIAGNOSIS — R590 Localized enlarged lymph nodes: Secondary | ICD-10-CM | POA: Diagnosis not present

## 2022-03-07 ENCOUNTER — Encounter: Payer: Self-pay | Admitting: *Deleted

## 2022-03-09 ENCOUNTER — Other Ambulatory Visit: Payer: Self-pay | Admitting: Physician Assistant

## 2022-03-31 ENCOUNTER — Encounter: Payer: Self-pay | Admitting: Internal Medicine

## 2022-03-31 ENCOUNTER — Ambulatory Visit: Payer: Medicare HMO | Admitting: Internal Medicine

## 2022-03-31 VITALS — BP 130/82 | HR 90 | Ht 64.5 in | Wt 227.0 lb

## 2022-03-31 DIAGNOSIS — E0590 Thyrotoxicosis, unspecified without thyrotoxic crisis or storm: Secondary | ICD-10-CM

## 2022-03-31 DIAGNOSIS — Z23 Encounter for immunization: Secondary | ICD-10-CM | POA: Diagnosis not present

## 2022-03-31 LAB — T3, FREE: T3, Free: 3.3 pg/mL (ref 2.3–4.2)

## 2022-03-31 LAB — T4, FREE: Free T4: 0.82 ng/dL (ref 0.60–1.60)

## 2022-03-31 LAB — TSH: TSH: 2.38 u[IU]/mL (ref 0.35–5.50)

## 2022-03-31 NOTE — Progress Notes (Signed)
Patient ID: Misty Cabrera, female   DOB: 1951-04-24, 71 y.o.   MRN: [id number]  HPI  Misty Cabrera is a 71 y.o.-year-old female, returning for follow-up for thyroid nodules and thyrotoxicosis.  She previously saw Dr. Loanne Drilling, last visit 6 months ago.  Patient was diagnosed with thyrotoxicosis in 2018 when she experienced palpitations >> sent to the ED.  She was started on Metoprolol and then methimazole, currently on 2.5 mg daily. Now off for 2 weeks (forgot).  I reviewed pt's thyroid tests: Lab Results  Component Value Date   TSH 2.57 09/30/2021   TSH 1.95 05/17/2021   TSH 3.17 10/12/2020   TSH 4.45 04/02/2020   TSH 3.84 10/01/2019   TSH 3.680 05/15/2019   TSH 3.13 01/01/2019   TSH 3.48 07/09/2018   TSH 3.72 05/15/2018   TSH 5.02 (H) 03/08/2018   FREET4 0.86 09/30/2021   FREET4 0.95 05/17/2021   FREET4 0.95 10/12/2020   FREET4 0.69 04/02/2020   FREET4 0.83 10/01/2019   FREET4 0.88 01/01/2019   FREET4 0.94 07/09/2018   FREET4 0.73 05/15/2018   FREET4 0.95 03/08/2018   FREET4 0.71 12/05/2017   Antithyroid antibodies: No results found for: "TSI"  She denies: - fatigue - excessive sweating/heat intolerance - tremors - anxiety - palpitations - hyperdefecation - weight loss, she actually had weight gain - hair loss, but hair is thinner  She also has a history of a thyroid nodules:  Thyroid U/S (10/29/2019): Parenchymal Echotexture: Mildly heterogenous  Isthmus: 0.4 cm  Right lobe: 5.8 x 1.6 x 1.8 cm  Left lobe: 4.0 x 1.5 x 1.6 cm  _________________________________________________________   Estimated total number of nodules >/= 1 cm: 2 _________________________________________________________   Nodule # 1: Pseudo nodule/lobulation posterior to the mid thyroid gland. This is not felt to represent a true thyroid nodule.  _________________________________________________________   Nodule # 2:  Location: Right; Inferior  Maximum size: 1.9 cm; Other 2 dimensions: 1.6 x  1.6 cm  Composition: solid/almost completely solid (2)  Echogenicity: isoechoic (1) *Given size (>/= 1.5 - 2.4 cm) and appearance, a follow-up ultrasound in 1 year should be considered based on TI-RADS criteria. _________________________________________________________   IMPRESSION: Approximately 1.9 cm TI-RADS category 3 (mildly suspicious) nodule in the right inferior gland meets criteria for surveillance. Recommend follow-up ultrasound in 1 year.  Thyroid U/S (09/08/2021): Parenchymal Echotexture: Mildly heterogenous  Isthmus: 0.3 cm  Right lobe: 6.1 x 2.1 x 2.0 cm  Left lobe: 4.7 x 1.2 x 1.9 cm  _________________________________________________________   Estimated total number of nodules >/= 1 cm: 1 _________________________________________________________   Nodule # 1:  Prior biopsy: No  Location: Right; inferior  Maximum size: 2.3 cm; Other 2 dimensions: 2.1 x 1.9 cm, previously, 2.0 x 2.0 x 1.9 cm  Composition: solid/almost completely solid (2)  Echogenicity: isoechoic (1) *Given size (>/= 1.5 - 2.4 cm) and appearance, a follow-up ultrasound in 1 year should be considered based on TI-RADS criteria.  _________________________________________________________   Nodules 2 and 3 are subcentimeter and located within the left thyroid lobe. Given their size, these nodules do not meet criteria for FNA or imaging follow-up.   IMPRESSION: Nodule 1 (TI-RADS 3), measuring 2.3 cm, located in the inferior right thyroid lobe is unchanged since prior examination (10/29/2019) and still meets criteria for imaging follow-up. Annual ultrasound surveillance is recommended until 5 years of stability is documented.  Pt. Recently had a neck ultrasound to investigate a lymph node (01/21/2022).  The results were negative for pathology.  Pt denies: -  feeling nodules in neck - hoarseness - dysphagia - choking - SOB with lying down  Pt has a FH of thyroid ds. In mother >> goiter -  removed. No FH of thyroid cancer. No h/o radiation tx to head or neck. No recent contrast studies. No steroid use. No herbal supplements. No Biotin use.  Pt. also has a history of HTN.  She has a business - a Company secretary.  ROS: + see HPI  Past Medical History:  Diagnosis Date   Essential hypertension, benign 05/15/2019   Hyperthyroidism    Tachycardia 05/15/2019   Past Surgical History:  Procedure Laterality Date   AUGMENTATION MAMMAPLASTY  1980   TONSILLECTOMY     Social History   Socioeconomic History   Marital status: Divorced    Spouse name: Not on file   Number of children: Not on file   Years of education: Not on file   Highest education level: Not on file  Occupational History   Not on file  Tobacco Use   Smoking status: Never   Smokeless tobacco: Never  Vaping Use   Vaping Use: Never used  Substance and Sexual Activity   Alcohol use: No   Drug use: No   Sexual activity: Not Currently    Birth control/protection: None  Other Topics Concern   Not on file  Social History Narrative   Lives with son who is 38 and going to school for IT sales professional   Social Determinants of Health   Financial Resource Strain: Low Risk  (11/05/2021)   Overall Financial Resource Strain (CARDIA)    Difficulty of Paying Living Expenses: Not hard at all  Food Insecurity: No Food Insecurity (11/05/2021)   Hunger Vital Sign    Worried About Running Out of Food in the Last Year: Never true    [place] in the Last Year: Never true  Transportation Needs: No Transportation Needs (11/05/2021)   PRAPARE - Hydrologist (Medical): No    Lack of Transportation (Non-Medical): No  Physical Activity: Inactive (11/05/2021)   Exercise Vital Sign    Days of Exercise per Week: 0 days    Minutes of Exercise per Session: 0 min  Stress: No Stress Concern Present (11/05/2021)   [place]     Feeling of Stress : Not at all  Social Connections: Socially Isolated (11/05/2021)   Social Connection and Isolation Panel [NHANES]    Frequency of Communication with Friends and Family: More than three times a week    Frequency of Social Gatherings with Friends and Family: More than three times a week    Attends Religious Services: Never    Marine scientist or Organizations: No    Attends Archivist Meetings: Never    Marital Status: Divorced  Human resources officer Violence: Not At Risk (11/05/2021)   Humiliation, Afraid, Rape, and Kick questionnaire    Fear of Current or Ex-Partner: No    Emotionally Abused: No    Physically Abused: No    Sexually Abused: No   Current Outpatient Medications on File Prior to Visit  Medication Sig Dispense Refill   atorvastatin (LIPITOR) 10 MG tablet Take 1 tablet (10 mg total) by mouth at bedtime. 90 tablet 3   losartan-hydrochlorothiazide (HYZAAR) 50-12.5 MG tablet TAKE 1 TABLET BY MOUTH EVERY DAY 90 tablet 2   methimazole (TAPAZOLE) 5 MG tablet TAKE 1/2 TABLET BY MOUTH EVERY DAY 45 tablet  1   metoprolol tartrate (LOPRESSOR) 25 MG tablet TAKE 1/2 TABLET BY MOUTH EVERY DAY 45 tablet 2   No current facility-administered medications on file prior to visit.   No Known Allergies Family History  Problem Relation Age of Onset   Diabetes Maternal Aunt    Diabetes Maternal Uncle    Goiter Mother    PE: BP 130/82 (BP Location: Right Arm, Patient Position: Sitting, Cuff Size: Normal)   Pulse 90   Ht 5' 4.5" (1.638 m)   Wt 227 lb (103 kg)   SpO2 97%   BMI 38.36 kg/m  Wt Readings from Last 3 Encounters:  03/31/22 227 lb (103 kg)  01/19/22 225 lb (102.1 kg)  09/30/21 232 lb 12.8 oz (105.6 kg)   Constitutional: overweight, in NAD Eyes: EOMI, no exophthalmos, no lid lag, no stare ENT: no thyromegaly, no thyroid masses palpable, no cervical lymphadenopathy Cardiovascular: RRR, No MRG Respiratory: CTA B Musculoskeletal: no  deformities Skin: moist, warm, no rashes Neurological: no tremor with outstretched hands  ASSESSMENT: 1. Thyrotoxicosis  2.  Thyroid nodules  PLAN:  1. Patient with a h/o low TSH, initially with significant palpitations and ches discomfort ("I thought I was having a heart attack"), which resolved after starting metoprolol, and then methimazole.  - she does not appear to have exogenous causes for the low TSH.  - We discussed that possible causes of thyrotoxicosis are:  Graves ds   Thyroiditis toxic multinodular goiter/ toxic adenoma - she was on a low dose Methimazole for suspected Graves' disease (or toxic nodules), but she did not have further investigation for these. - TFTs were normal at last check so she was encouraged to continue on the same dose of methimazole, 2.5 mg daily - however, at this visit, she tells me that she did not take this in 2 weeks, as she forgot - at today's visit, will check the TSH, fT3 and fT4 and also add thyroid stimulating antibodies to screen for Graves' disease.  - If the etiology is still not clear and her tests worsen, we may need an uptake and scan to differentiate between the 3 above possible etiologies  - we discussed about possible modalities of treatment for the above conditions, to include methimazole use, radioactive iodine ablation or (last resort) surgery.  If her TFTs are abnormal today, we plan to restart methimazole. - I do not feel that we need to add beta blockers at this time, since she is not tachycardic. (No tachycardia at the time of the exam) or tremulous - no signs of Graves' ophthalmopathy: she does not have any double vision, blurry vision, eye pain, chemosis. - RTC in 1 year, but possibly sooner for repeat labs  2.  Patient with a history of thyroid nodules, of which approximately 2 cm dominant right inferior nodule, which appears to be isoechoic, and without other worrisome features. -Specifically, the nodule is not hypoechoic,  does not have microcalcifications, internal blood flow, irregular contours, or a taller-than-wide disposition -her thyroid ultrasound reports were reviewed including the latest from 09/08/2021.  The nodule increased very slightly in size, not a significant change. -Follow-up with ultrasounds as recommended for 5 years.  Plan to repeat another ultrasound at next visit.  Orders Placed This Encounter  Procedures   Flu Vaccine QUAD High Dose(Fluad)   TSH   T4, free   T3, free   Thyroid stimulating immunoglobulin   - Total time spent for the visit: 40 min, in precharting, reviewing Dr. Cordelia Pen last  note, obtaining medical information from the chart and from the pt, reviewing her  previous labs, evaluations, and treatments, reviewing her symptoms, counseling her about her thyroid conditions (please see the discussed topics above), and developing a plan to further investigate and treat them.  Component     Latest Ref Rng 03/31/2022  TSH     0.35 - 5.50 uIU/mL 2.38   T4,Free(Direct)     0.60 - 1.60 ng/dL 0.82   Triiodothyronine,Free,Serum     2.3 - 4.2 pg/mL 3.3   TSI     <140 % baseline <89    Labs are all normal.  We can continue without methimazole for now, but I would like to repeat her test in 3 months.  Philemon Kingdom, MD PhD [facility] Endocrinology

## 2022-03-31 NOTE — Patient Instructions (Signed)
Continue off Methimazole.  Please stop at the lab.  Please return in 1  year.

## 2022-04-04 LAB — THYROID STIMULATING IMMUNOGLOBULIN: TSI: <89 % baseline (ref <140)

## 2022-04-06 ENCOUNTER — Other Ambulatory Visit: Payer: Self-pay | Admitting: Internal Medicine

## 2022-04-17 IMAGING — US US THYROID
1 series · 13 of 25 positions shown · non-contrast
Comparison: 10/29/2019

CLINICAL DATA: Thyroid nodule follow-up

EXAM:
THYROID ULTRASOUND
TECHNIQUE: Ultrasound examination of the thyroid gland and adjacent soft
tissues was performed.

[Series 1: us thyroid · 0.06mm/px · 13 of 44 slices shown]
[im 1/44]
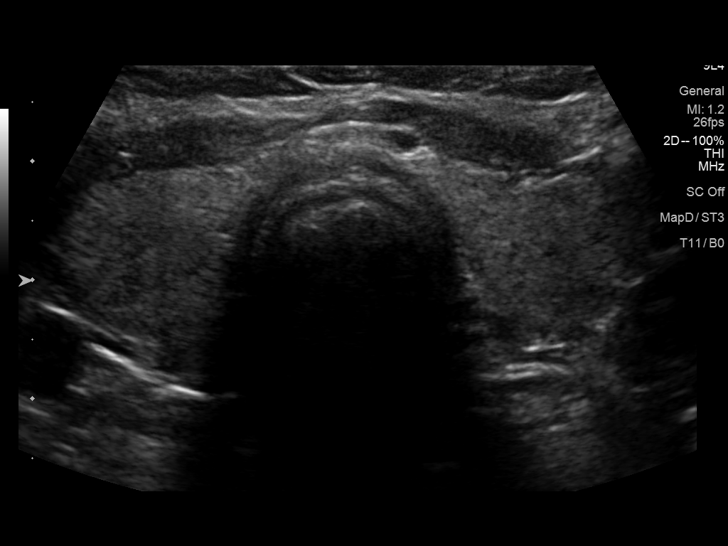
[im 4/44]
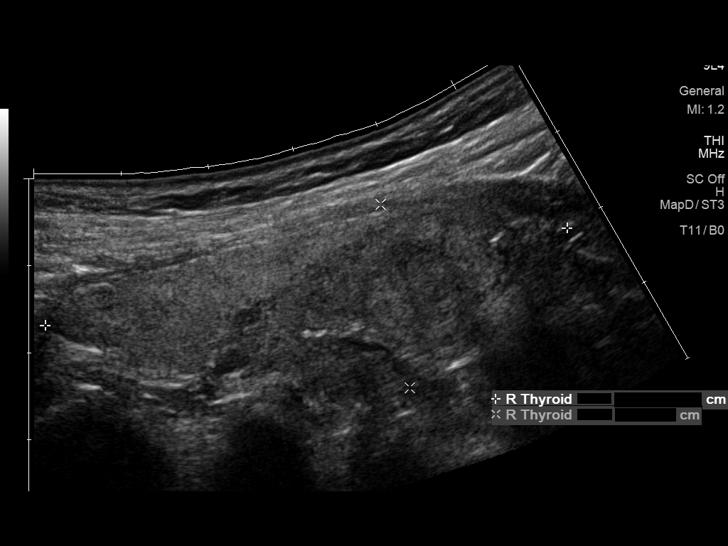
[im 8/44]
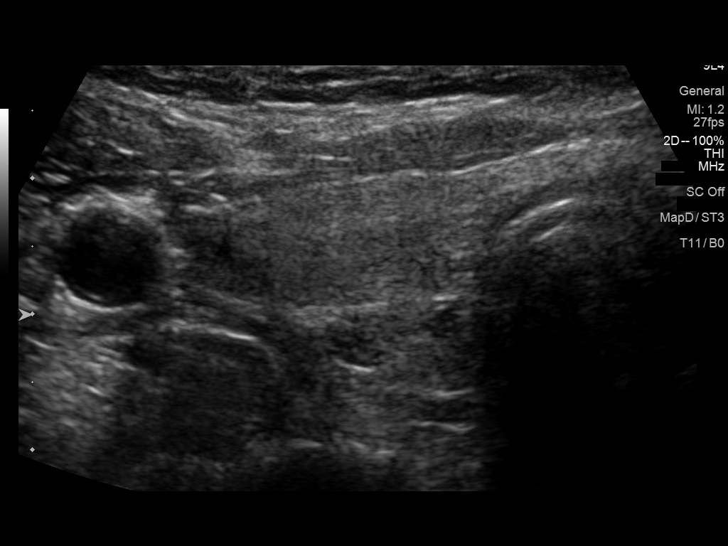
[im 11/44]
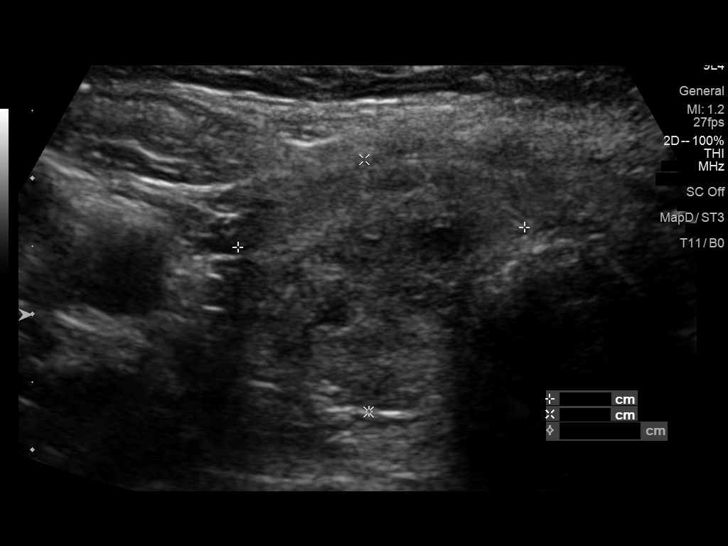
[im 15/44]
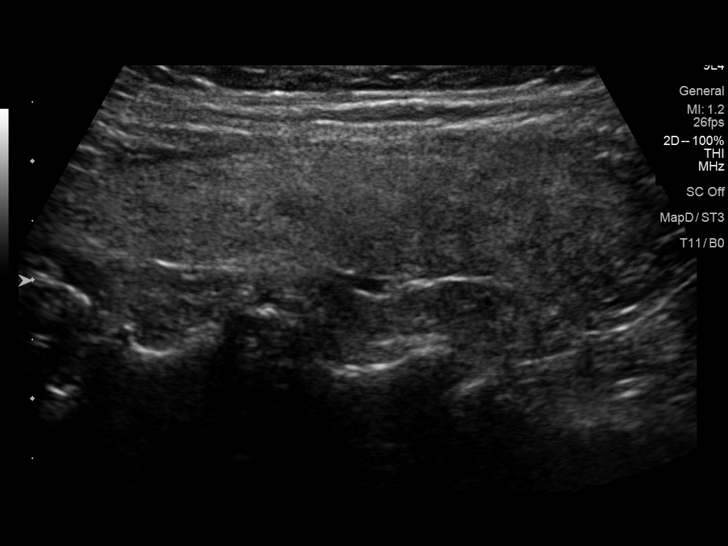
[im 18/44]
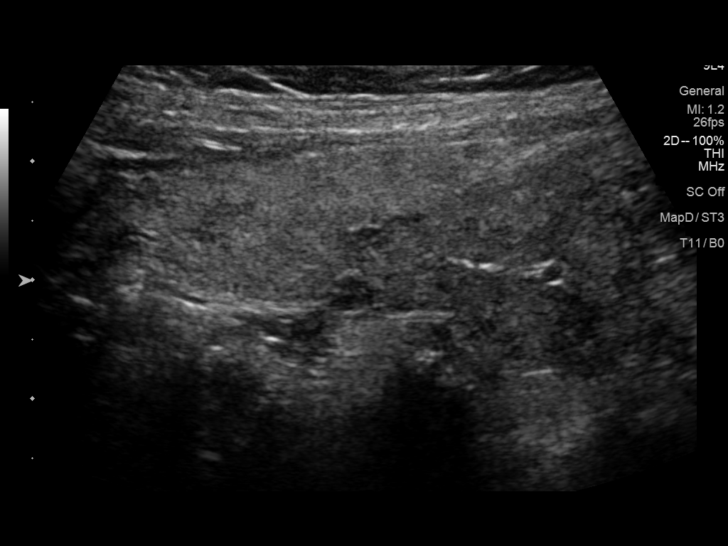
[im 22/44]
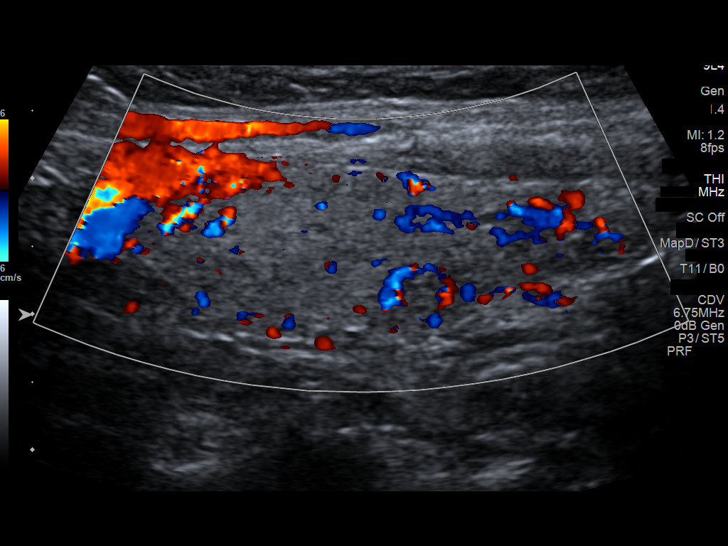
[im 26/44]
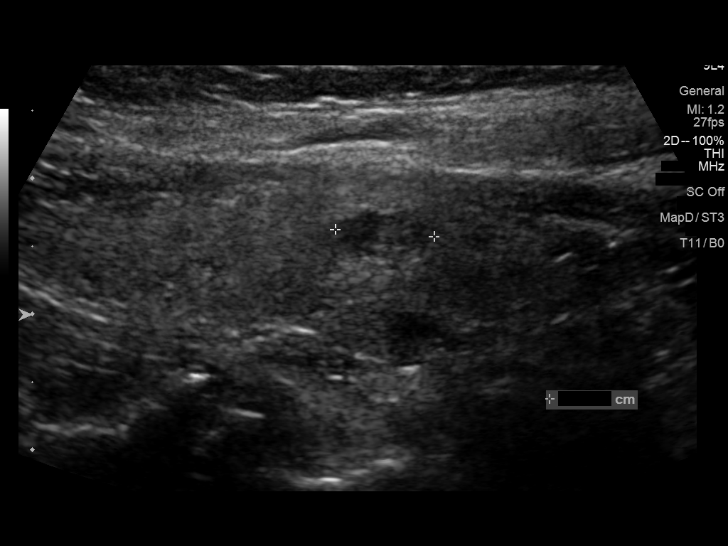
[im 29/44]
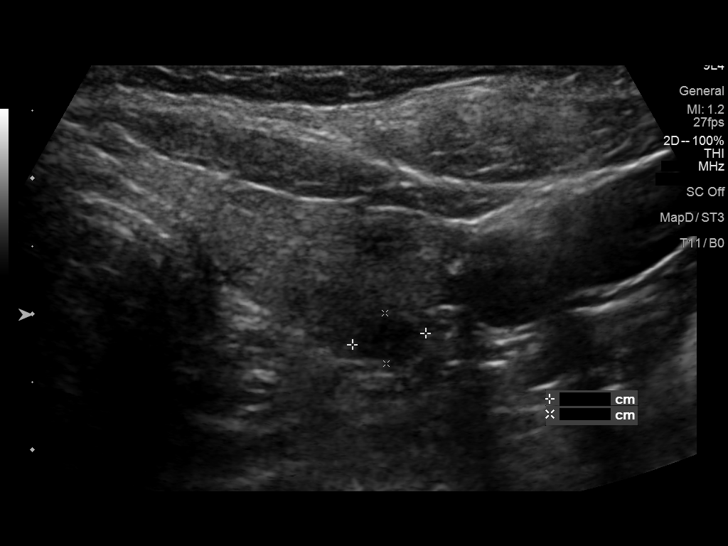
[im 33/44]
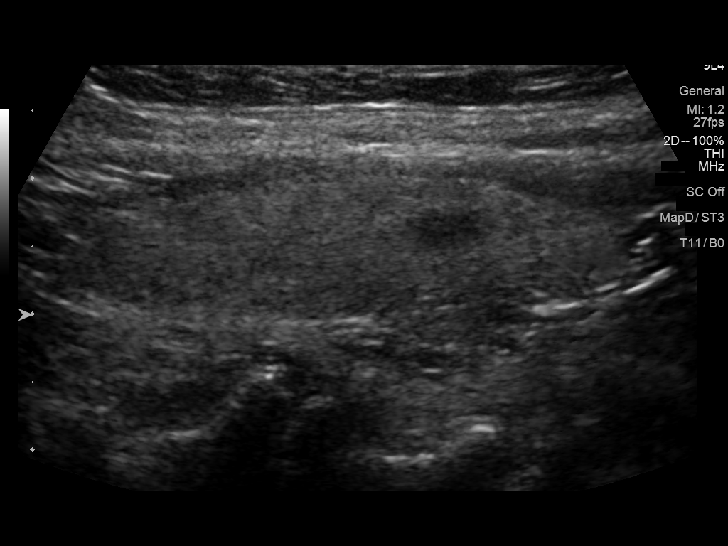
[im 36/44]
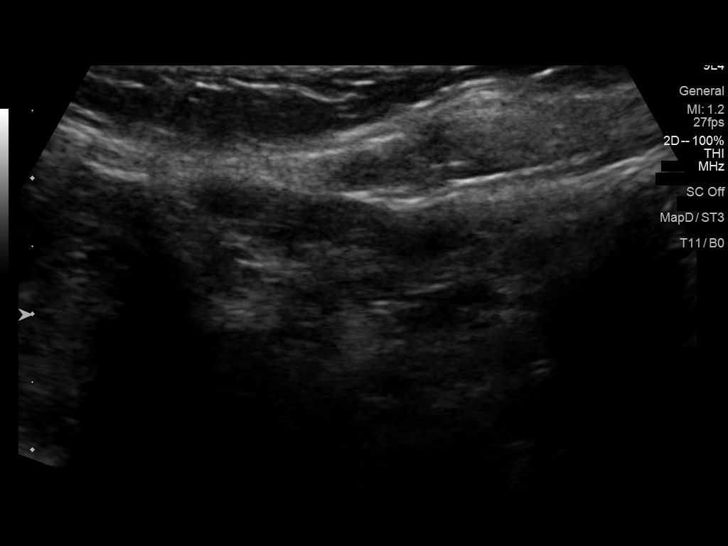
[im 40/44]
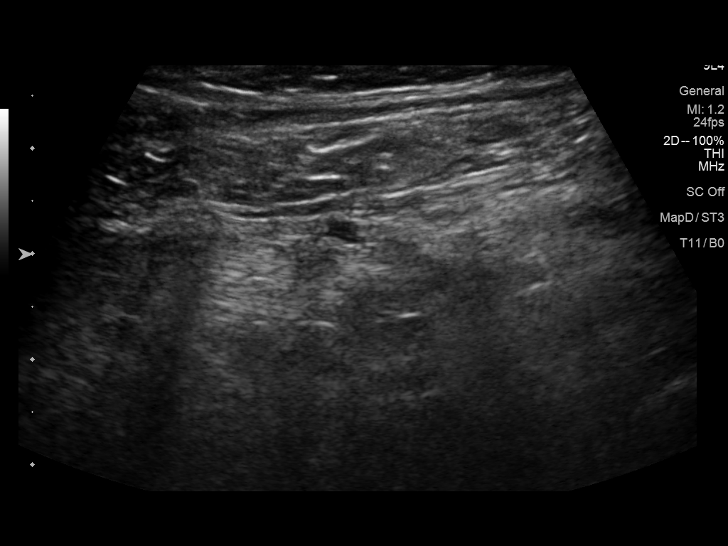
[im 44/44]
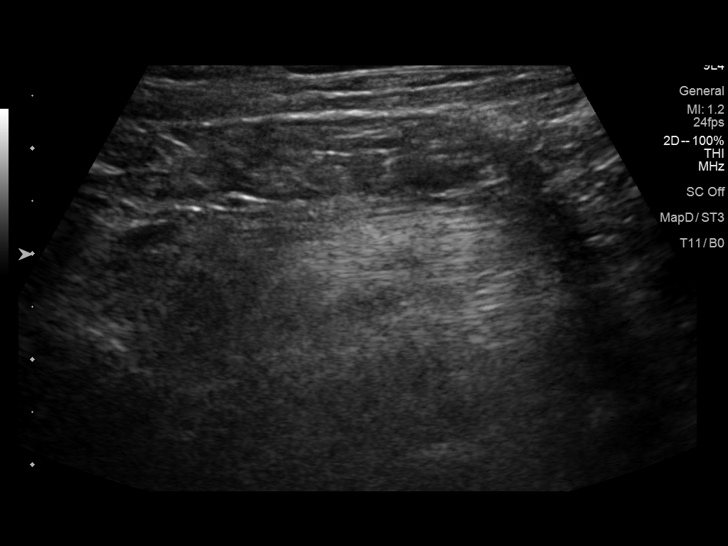

[13 of 25 positions shown; findings below may reference images not displayed]

FINDINGS: Parenchymal Echotexture: Mildly heterogenous

Isthmus: 0.3 cm

Right lobe: 6.1 x 2.1 x 2.0 cm

Left lobe: 4.7 x 1.2 x 1.9 cm

_________________________________________________________

Estimated total number of nodules >/= 1 cm: 1

Number of spongiform nodules >/=  2 cm not described below (TR1): 0

Number of mixed cystic and solid nodules >/= 1.5 cm not described
below (TR2): 0

_________________________________________________________

Nodule # 1:

Prior biopsy: No

Location: Right; inferior

Maximum size: 2.3 cm; Other 2 dimensions: 2.1 x 1.9 cm, previously,
2.0 x 2.0 x 1.9 cm

Composition: solid/almost completely solid (2)

Echogenicity: isoechoic (1)

Shape: not taller-than-wide (0)

Margins: ill-defined (0)

Echogenic foci: none (0)

ACR TI-RADS total points: 3.

ACR TI-RADS risk category:  TR3 (3 points).

Significant change in size (>/= 20% in two dimensions and minimal
increase of 2 mm):

Change in features: No

Change in ACR TI-RADS risk category: No

ACR TI-RADS recommendations:

*Given size (>/= 1.5 - 2.4 cm) and appearance, a follow-up
ultrasound in 1 year should be considered based on TI-RADS criteria.

_________________________________________________________

Nodules 2 and 3 are subcentimeter and located within the left
thyroid lobe. Given their size, these nodules do not meet criteria
for FNA or imaging follow-up.
IMPRESSION: Nodule 1 (TI-RADS 3), measuring 2.3 cm, located in the inferior
right thyroid lobe is unchanged since prior examination (10/29/2019)
and still meets criteria for imaging follow-up. Annual ultrasound
surveillance is recommended until 5 years of stability is
documented.

The above is in keeping with the ACR TI-RADS recommendations - [HOSPITAL] 2416;[DATE].

## 2022-04-26 ENCOUNTER — Other Ambulatory Visit: Payer: Self-pay | Admitting: Internal Medicine

## 2022-10-06 ENCOUNTER — Ambulatory Visit: Payer: Medicare HMO | Admitting: Internal Medicine

## 2022-10-06 ENCOUNTER — Encounter: Payer: Self-pay | Admitting: Internal Medicine

## 2022-10-06 VITALS — BP 124/86 | HR 94 | Ht 64.5 in | Wt 230.2 lb

## 2022-10-06 DIAGNOSIS — E042 Nontoxic multinodular goiter: Secondary | ICD-10-CM

## 2022-10-06 DIAGNOSIS — E0590 Thyrotoxicosis, unspecified without thyrotoxic crisis or storm: Secondary | ICD-10-CM

## 2022-10-06 LAB — T4, FREE: Free T4: 0.85 ng/dL (ref 0.60–1.60)

## 2022-10-06 LAB — TSH: TSH: 1.59 u[IU]/mL (ref 0.35–5.50)

## 2022-10-06 LAB — T3, FREE: T3, Free: 3.1 pg/mL (ref 2.3–4.2)

## 2022-10-06 NOTE — Patient Instructions (Addendum)
Continue off Methimazole.  Please stop at the lab.  Let's schedule another thyroid ultrasound.  Please return in 1 year.

## 2022-10-06 NOTE — Progress Notes (Signed)
Patient ID: Misty Cabrera, female   DOB: 06-09-1951, 72 y.o.   MRN: [id number]  HPI  Misty Cabrera is a 72 y.o.-year-old female, returning for follow-up for thyroid nodules and thyrotoxicosis.  She previously saw Dr. Everardo All, but last visit with me 6 months ago.  Interim history: She feels well at today's visit, without complaints except 4 lb weight gain. She has no tremor, palpitations, heat intolerance.  Reviewed and addended history: Patient was diagnosed with thyrotoxicosis in 2018 when she experienced palpitations >> sent to the ED.  She was started on Metoprolol and then methimazole, 2.5 mg daily at last visit with Dr. Everardo All.  However, she was off the medication at our first visit in 03/2022 and we did not have to restart it.  I reviewed pt's thyroid tests: Lab Results  Component Value Date   TSH 2.38 03/31/2022   TSH 2.57 09/30/2021   TSH 1.95 05/17/2021   TSH 3.17 10/12/2020   TSH 4.45 04/02/2020   TSH 3.84 10/01/2019   TSH 3.680 05/15/2019   TSH 3.13 01/01/2019   TSH 3.48 07/09/2018   TSH 3.72 05/15/2018   FREET4 0.82 03/31/2022   FREET4 0.86 09/30/2021   FREET4 0.95 05/17/2021   FREET4 0.95 10/12/2020   FREET4 0.69 04/02/2020   FREET4 0.83 10/01/2019   FREET4 0.88 01/01/2019   FREET4 0.94 07/09/2018   FREET4 0.73 05/15/2018   FREET4 0.95 03/08/2018   T3FREE 3.3 03/31/2022   Antithyroid antibodies: Lab Results  Component Value Date   TSI <89 03/31/2022   She also has a history of a thyroid nodules:  Thyroid U/S (10/29/2019): Parenchymal Echotexture: Mildly heterogenous  Isthmus: 0.4 cm  Right lobe: 5.8 x 1.6 x 1.8 cm  Left lobe: 4.0 x 1.5 x 1.6 cm  _________________________________________________________   Estimated total number of nodules >/= 1 cm: 2 _________________________________________________________   Nodule # 1: Pseudo nodule/lobulation posterior to the mid thyroid gland. This is not felt to represent a true thyroid nodule.   _________________________________________________________   Nodule # 2:  Location: Right; Inferior  Maximum size: 1.9 cm; Other 2 dimensions: 1.6 x 1.6 cm  Composition: solid/almost completely solid (2)  Echogenicity: isoechoic (1) *Given size (>/= 1.5 - 2.4 cm) and appearance, a follow-up ultrasound in 1 year should be considered based on TI-RADS criteria. _________________________________________________________   IMPRESSION: Approximately 1.9 cm TI-RADS category 3 (mildly suspicious) nodule in the right inferior gland meets criteria for surveillance. Recommend follow-up ultrasound in 1 year.  Thyroid U/S (09/08/2021): Parenchymal Echotexture: Mildly heterogenous  Isthmus: 0.3 cm  Right lobe: 6.1 x 2.1 x 2.0 cm  Left lobe: 4.7 x 1.2 x 1.9 cm  _________________________________________________________   Estimated total number of nodules >/= 1 cm: 1 _________________________________________________________   Nodule # 1:  Prior biopsy: No  Location: Right; inferior  Maximum size: 2.3 cm; Other 2 dimensions: 2.1 x 1.9 cm, previously, 2.0 x 2.0 x 1.9 cm  Composition: solid/almost completely solid (2)  Echogenicity: isoechoic (1) *Given size (>/= 1.5 - 2.4 cm) and appearance, a follow-up ultrasound in 1 year should be considered based on TI-RADS criteria.  _________________________________________________________   Nodules 2 and 3 are subcentimeter and located within the left thyroid lobe. Given their size, these nodules do not meet criteria for FNA or imaging follow-up.   IMPRESSION: Nodule 1 (TI-RADS 3), measuring 2.3 cm, located in the inferior right thyroid lobe is unchanged since prior examination (10/29/2019) and still meets criteria for imaging follow-up. Annual ultrasound surveillance is recommended until 5  years of stability is documented.  Pt. had a neck ultrasound to investigate a lymph node (01/21/2022).  The results were negative for pathology.  Pt denies: -  feeling nodules in neck - hoarseness - dysphagia  Pt has a FH of thyroid ds. In mother >> goiter - removed. No FH of thyroid cancer. No h/o radiation tx to head or neck. No recent contrast studies. No steroid use. No herbal supplements. No Biotin use.  Pt. also has a history of HTN.  She has a business - a Chief Strategy Officer. She is on a supplement: Trim-fit >> has more energy and feels better.  ROS: + see HPI  Past Medical History:  Diagnosis Date   Essential hypertension, benign 05/15/2019   Hyperthyroidism    Tachycardia 05/15/2019   Past Surgical History:  Procedure Laterality Date   AUGMENTATION MAMMAPLASTY  1980   TONSILLECTOMY     Social History   Socioeconomic History   Marital status: Divorced    Spouse name: Not on file   Number of children: Not on file   Years of education: Not on file   Highest education level: Not on file  Occupational History   Not on file  Tobacco Use   Smoking status: Never   Smokeless tobacco: Never  Vaping Use   Vaping Use: Never used  Substance and Sexual Activity   Alcohol use: No   Drug use: No   Sexual activity: Not Currently    Birth control/protection: None  Other Topics Concern   Not on file  Social History Narrative   Lives with son who is 73 and going to school for Counselling psychologist   Social Determinants of Health   Financial Resource Strain: Low Risk  (11/05/2021)   Overall Financial Resource Strain (CARDIA)    Difficulty of Paying Living Expenses: Not hard at all  Food Insecurity: No Food Insecurity (11/05/2021)   Hunger Vital Sign    Worried About Running Out of Food in the Last Year: Never true    Ran Out of Food in the Last Year: Never true  Transportation Needs: No Transportation Needs (11/05/2021)   PRAPARE - Administrator, Civil Service (Medical): No    Lack of Transportation (Non-Medical): No  Physical Activity: Inactive (11/05/2021)   Exercise Vital Sign    Days of Exercise per Week: 0 days     Minutes of Exercise per Session: 0 min  Stress: No Stress Concern Present (11/05/2021)   [employer] of Occupational Health - Occupational Stress Questionnaire    Feeling of Stress : Not at all  Social Connections: Socially Isolated (11/05/2021)   Social Connection and Isolation Panel [NHANES]    Frequency of Communication with Friends and Family: More than three times a week    Frequency of Social Gatherings with Friends and Family: More than three times a week    Attends Religious Services: Never    Database administrator or Organizations: No    Attends Banker Meetings: Never    Marital Status: Divorced  Catering manager Violence: Not At Risk (11/05/2021)   Humiliation, Afraid, Rape, and Kick questionnaire    Fear of Current or Ex-Partner: No    Emotionally Abused: No    Physically Abused: No    Sexually Abused: No   Current Outpatient Medications on File Prior to Visit  Medication Sig Dispense Refill   atorvastatin (LIPITOR) 10 MG tablet Take 1 tablet (10 mg total) by mouth at bedtime. 90 tablet  3   losartan-hydrochlorothiazide (HYZAAR) 50-12.5 MG tablet TAKE 1 TABLET BY MOUTH EVERY DAY 90 tablet 2   metoprolol tartrate (LOPRESSOR) 25 MG tablet TAKE 1/2 TABLET BY MOUTH EVERY DAY 45 tablet 2   No current facility-administered medications on file prior to visit.   No Known Allergies Family History  Problem Relation Age of Onset   Diabetes Maternal Aunt    Diabetes Maternal Uncle    Goiter Mother    PE: BP 124/86 (BP Location: Right Arm, Patient Position: Sitting, Cuff Size: Normal)   Pulse 94   Ht 5' 4.5" (1.638 m)   Wt 230 lb 3.2 oz (104.4 kg)   SpO2 96%   BMI 38.90 kg/m  Wt Readings from Last 3 Encounters:  10/06/22 230 lb 3.2 oz (104.4 kg)  03/31/22 227 lb (103 kg)  01/19/22 225 lb (102.1 kg)   Constitutional: overweight, in NAD Eyes: no exophthalmos, no lid lag, no stare ENT: no thyromegaly, no thyroid masses palpable, no cervical  lymphadenopathy Cardiovascular: tachycardia, RR, No MRG Respiratory: CTA B Musculoskeletal: no deformities Skin:  no rashes Neurological: no tremor with outstretched hands  ASSESSMENT: 1. Thyrotoxicosis  2.  Thyroid nodules  PLAN:  1. Patient with history of a low TSH, initially with significant palpitations and chest discomfort, mimicking an MI, which resolved after starting metoprolol, and then methimazole. -At last visit, she was supposed to be on methimazole 2.5 mg daily but she mentions that she was off for 2 weeks. -TFTs were normal at last visit so we continued off methimazole.  I sent her a message about the results but she mentions that she did not hear about them.  Reviewing my chart, it appears that the patient did not read the message but she mentions that it could have been her son.  We spent a significant amount of time discussing accessibility to MyChart and I offered to inactivate this for her since she tells me she cannot login MyChart by herself.  However, she would like to continue to have MyChart but ask her son's help in checking the messages. -We did review at last visit possible causes for thyrotoxicosis to include Graves' disease, subacute thyroiditis, and toxic multinodular goiter/toxic adenoma and we reviewed possible treatments for each of these conditions -At last visit, her TSI antibodies were negative, ruling out active Graves' disease -At today's visit, she mentions no thyrotoxic signs or symptoms -Also, no active signs of thyroid eye disease: No double vision, blurry vision, eye pain, chemosis -Will  recheck her  thyroid tests today and see if we need to restart methimazole -I will see her back in a year, but possibly sooner for labs  2.  Patient with a history of thyroid nodules, of which approximately 2 cm dominant right inferior nodule, which appears to be isoechoic and without other worrisome features -Specifically, the nodule is not hypoechoic, does not  have microcalcifications, internal blood flow, irregular contours, or a taller-than-wide disposition -Reviewed her latest thyroid ultrasound report from 09/08/2021.  The nodule increased very slightly in size, not a significant change. -At today's visit we will repeat her ultrasound -No neck compression symptoms or masses felt on palpation of her neck today  Component     Latest Ref Rng 10/06/2022  TSH     0.35 - 5.50 uIU/mL 1.59   T4,Free(Direct)     0.60 - 1.60 ng/dL 1.61   Triiodothyronine,Free,Serum     2.3 - 4.2 pg/mL 3.1   TFTs are normal.  No need  to restart methimazole for now.  Plan to repeat her TFTs in 6 months.  Carlus Pavlov, MD PhD [facility] Endocrin[facility]

## 2023-01-23 ENCOUNTER — Encounter: Payer: Medicare HMO | Admitting: Physician Assistant

## 2023-02-21 ENCOUNTER — Ambulatory Visit
Admission: RE | Admit: 2023-02-21 | Discharge: 2023-02-21 | Disposition: A | Discharge status: Home / Self Care | Payer: Medicare HMO | Source: Ambulatory Visit | Attending: Internal Medicine | Admitting: Internal Medicine

## 2023-02-21 DIAGNOSIS — E041 Nontoxic single thyroid nodule: Secondary | ICD-10-CM | POA: Diagnosis not present

## 2023-02-21 DIAGNOSIS — E042 Nontoxic multinodular goiter: Secondary | ICD-10-CM

## 2023-04-27 ENCOUNTER — Ambulatory Visit: Payer: Medicare HMO | Admitting: Physician Assistant

## 2023-04-27 VITALS — BP 156/82 | HR 84 | Temp 97.3°F | Ht 64.5 in | Wt 229.2 lb

## 2023-04-27 DIAGNOSIS — I10 Essential (primary) hypertension: Secondary | ICD-10-CM

## 2023-04-27 DIAGNOSIS — E041 Nontoxic single thyroid nodule: Secondary | ICD-10-CM

## 2023-04-27 DIAGNOSIS — E782 Mixed hyperlipidemia: Secondary | ICD-10-CM | POA: Diagnosis not present

## 2023-04-27 DIAGNOSIS — Z23 Encounter for immunization: Secondary | ICD-10-CM | POA: Diagnosis not present

## 2023-04-27 DIAGNOSIS — R599 Enlarged lymph nodes, unspecified: Secondary | ICD-10-CM

## 2023-04-27 DIAGNOSIS — Z0000 Encounter for general adult medical examination without abnormal findings: Secondary | ICD-10-CM

## 2023-04-27 DIAGNOSIS — Z131 Encounter for screening for diabetes mellitus: Secondary | ICD-10-CM

## 2023-04-27 LAB — CBC WITH DIFFERENTIAL/PLATELET
Basophils Absolute: 0 10*3/uL (ref 0.0–0.1)
Basophils Relative: 0.5 % (ref 0.0–3.0)
Eosinophils Absolute: 0.1 10*3/uL (ref 0.0–0.7)
Eosinophils Relative: 1.8 % (ref 0.0–5.0)
HCT: 42.2 % (ref 36.0–46.0)
Hemoglobin: 14.1 g/dL (ref 12.0–15.0)
Lymphocytes Relative: 32 % (ref 12.0–46.0)
Lymphs Abs: 1.9 10*3/uL (ref 0.7–4.0)
MCHC: 33.4 g/dL (ref 30.0–36.0)
MCV: 96.3 fL (ref 78.0–100.0)
Monocytes Absolute: 0.4 10*3/uL (ref 0.1–1.0)
Monocytes Relative: 6.2 % (ref 3.0–12.0)
Neutro Abs: 3.6 10*3/uL (ref 1.4–7.7)
Neutrophils Relative %: 59.5 % (ref 43.0–77.0)
Platelets: 245 10*3/uL (ref 150.0–400.0)
RBC: 4.39 Mil/uL (ref 3.87–5.11)
RDW: 13.3 % (ref 11.5–15.5)
WBC: 6 10*3/uL (ref 4.0–10.5)

## 2023-04-27 LAB — LIPID PANEL
Cholesterol: 235 mg/dL — ABNORMAL HIGH (ref 0–200)
HDL: 61.7 mg/dL (ref >39.00)
LDL Cholesterol: 152 mg/dL — ABNORMAL HIGH (ref 0–99)
NonHDL: 173.2
Total CHOL/HDL Ratio: 4
Triglycerides: 106 mg/dL (ref 0.0–149.0)
VLDL: 21.2 mg/dL (ref 0.0–40.0)

## 2023-04-27 LAB — COMPREHENSIVE METABOLIC PANEL
ALT: 15 U/L (ref 0–35)
AST: 19 U/L (ref 0–37)
Albumin: 4.2 g/dL (ref 3.5–5.2)
Alkaline Phosphatase: 79 U/L (ref 39–117)
BUN: 15 mg/dL (ref 6–23)
CO2: 30 meq/L (ref 19–32)
Calcium: 9.3 mg/dL (ref 8.4–10.5)
Chloride: 102 meq/L (ref 96–112)
Creatinine, Ser: 0.89 mg/dL (ref 0.40–1.20)
GFR: 64.44 mL/min (ref >60.00)
Glucose, Bld: 92 mg/dL (ref 70–99)
Potassium: 3.5 meq/L (ref 3.5–5.1)
Sodium: 140 meq/L (ref 135–145)
Total Bilirubin: 0.5 mg/dL (ref 0.2–1.2)
Total Protein: 7.3 g/dL (ref 6.0–8.3)

## 2023-04-27 LAB — HEMOGLOBIN A1C: Hgb A1c MFr Bld: 5.7 % (ref 4.6–6.5)

## 2023-04-27 LAB — TSH: TSH: 1.9 u[IU]/mL (ref 0.35–5.50)

## 2023-04-27 MED ORDER — ATORVASTATIN CALCIUM 10 MG PO TABS: 10.0000 mg | ORAL_TABLET | Freq: Every evening | ORAL | 3 refills | Status: DC

## 2023-04-27 MED ORDER — METOPROLOL TARTRATE 25 MG PO TABS: 12.5000 mg | ORAL_TABLET | Freq: Every day | ORAL | 11 refills | Status: DC

## 2023-04-27 MED ORDER — LOSARTAN POTASSIUM-HCTZ 50-12.5 MG PO TABS: 1.0000 | ORAL_TABLET | Freq: Every day | ORAL | 3 refills | Status: DC

## 2023-04-27 NOTE — Progress Notes (Unsigned)
Patient ID: Misty Cabrera, female    DOB: November 30, 1950, 72 y.o.   MRN: [id number]   Assessment & Plan:  Encounter for annual physical exam -     CBC with Differential/Platelet -     Comprehensive metabolic panel -     Lipid panel -     TSH -     Hemoglobin A1c  Enlarged lymph nodes -     Ambulatory referral to Interventional Radiology  Thyroid nodule -     Ambulatory referral to Interventional Radiology  Mixed hyperlipidemia -     Atorvastatin Calcium; Take 1 tablet (10 mg total) by mouth at bedtime.  Dispense: 90 tablet; Refill: 3  Essential hypertension -     Losartan Potassium-HCTZ; Take 1 tablet by mouth daily.  Dispense: 90 tablet; Refill: 3 -     Metoprolol Tartrate; Take 0.5 tablets (12.5 mg total) by mouth daily.  Dispense: 45 tablet; Refill: 11  Immunization due -     Flu Vaccine Trivalent High Dose (Fluad)   Assessment and Plan    Thyroid Nodules Noted to be larger on recent ultrasound. Biopsy recommended by endocrinologist. -Schedule biopsy with interventional radiology.  Supraclavicular Fullness Palpable fullness noted. Ultrasound technician suggested biopsy over repeat ultrasound. -Request simultaneous biopsy of supraclavicular fullness during thyroid nodule biopsy if deemed appropriate by interventional radiology.  Hypertension Patient has been non-compliant with medication (Losartan-HCTZ) due to issues with pharmacy communication. Blood pressure elevated at today's visit. -Resume Losartan-hydrochlorothiazide as prescribed. -Ensure pharmacy has correct contact information for patient. -Check blood pressure in 4 months or sooner if patient experiences symptoms.  Breast Health Patient has palpable breast implants from the 1970s. No abnormal findings on today's exam. -Recommend updating mammogram.  Colon Cancer Screening Patient has received at-home screening kit from insurance company. -Encourage patient to complete and return kit.  General Health  Maintenance -Flu shot administered today. -Follow-up appointment in 4 months to ensure completion of biopsies and to check blood pressure.       Patient Counseling: [x]   Nutrition: Stressed importance of moderation in sodium/caffeine intake, saturated fat and cholesterol, caloric balance, sufficient intake of fresh fruits, vegetables, and fiber.  [x]   Stressed the importance of regular exercise.   []   Substance Abuse: Discussed cessation/primary prevention of tobacco, alcohol, or other drug use; driving or other dangerous activities under the influence; availability of treatment for abuse.   []   Injury prevention: Discussed safety belts, safety helmets, smoke detector, smoking near bedding or upholstery.   []   Sexuality: Discussed sexually transmitted diseases, partner selection, use of condoms, avoidance of unintended pregnancy  and contraceptive alternatives.   [x]   Dental health: Discussed importance of regular tooth brushing, flossing, and dental visits.  [x]   Health maintenance and immunizations reviewed. Please refer to Health maintenance section.        Return in about 4 months (around 08/25/2023) for recheck/follow-up.    Subjective:    Chief Complaint  Patient presents with   Annual Exam    Pt in office for annual CPE and pt fasting for labs Wants to let PCP know about thyroid  May need refill on medication    HPI Discussed the use of AI scribe software for clinical note transcription with the patient, who gave verbal consent to proceed.  History of Present Illness   The patient, with a history of thyroid nodules and high blood pressure, presents with multiple health concerns. She reports that her thyroid nodules have grown larger and are due  for a biopsy. She also mentions palpable supraclavicular fullness, which she believes needs a biopsy as well. The patient has been working six days a week and admits to neglecting her medication regimen, only recently starting to  take her blood pressure medication, losartan, again. She also reports frequent urination and a bloodshot eye, questioning if these symptoms could be related to her high blood pressure. The patient has a history of breast implants and is due for a mammogram. She expresses concern about potential cancer risks associated with her health issues.        Past Medical History:  Diagnosis Date   Essential hypertension, benign 05/15/2019   Hyperthyroidism    Tachycardia 05/15/2019    Past Surgical History:  Procedure Laterality Date   AUGMENTATION MAMMAPLASTY  1980   TONSILLECTOMY      Family History  Problem Relation Age of Onset   Diabetes Maternal Aunt    Diabetes Maternal Uncle    Goiter Mother     Social History   Tobacco Use   Smoking status: Never   Smokeless tobacco: Never  Vaping Use   Vaping status: Never Used  Substance Use Topics   Alcohol use: No   Drug use: No     No Known Allergies  Review of Systems NEGATIVE UNLESS OTHERWISE INDICATED IN HPI      Objective:     BP (!) 156/82   Pulse 84   Temp (!) 97.3 F (36.3 C) (Temporal)   Ht 5' 4.5" (1.638 m)   Wt 229 lb 3.2 oz (104 kg)   SpO2 90%   BMI 38.73 kg/m   Wt Readings from Last 3 Encounters:  04/27/23 229 lb 3.2 oz (104 kg)  10/06/22 230 lb 3.2 oz (104.4 kg)  03/31/22 227 lb (103 kg)    BP Readings from Last 3 Encounters:  04/27/23 (!) 156/82  10/06/22 124/86  03/31/22 130/82     Physical Exam Vitals and nursing note reviewed.  Constitutional:      Appearance: Normal appearance. She is obese. She is not toxic-appearing.  HENT:     Head: Normocephalic and atraumatic.     Right Ear: Tympanic membrane, ear canal and external ear normal.     Left Ear: Tympanic membrane, ear canal and external ear normal.     Nose: Nose normal.     Mouth/Throat:     Mouth: Mucous membranes are moist.  Eyes:     Extraocular Movements: Extraocular movements intact.     Conjunctiva/sclera: Conjunctivae  normal.     Pupils: Pupils are equal, round, and reactive to light.  Neck:     Thyroid: Thyromegaly present.  Cardiovascular:     Rate and Rhythm: Normal rate and regular rhythm.     Pulses: Normal pulses.     Heart sounds: Normal heart sounds.  Pulmonary:     Effort: Pulmonary effort is normal.     Breath sounds: Normal breath sounds.  Abdominal:     General: Abdomen is flat. Bowel sounds are normal.     Palpations: Abdomen is soft.  Musculoskeletal:        General: Normal range of motion.     Cervical back: Normal range of motion and neck supple.  Lymphadenopathy:     Comments: No discrete lymph nodes enlarged, but patient does have noticeable bilateral "fullness" in supraclavicular regions   Skin:    General: Skin is warm and dry.  Neurological:     General: No focal deficit present.  Mental Status: She is alert and oriented to person, place, and time.  Psychiatric:        Mood and Affect: Mood normal.        Behavior: Behavior normal.        Thought Content: Thought content normal.        Judgment: Judgment normal.        Eutimio Gharibian M Zhamir Pirro, PA-C

## 2023-05-01 NOTE — Patient Instructions (Signed)
VISIT SUMMARY:  During today's visit, we discussed several health concerns including your thyroid nodules, supraclavicular fullness, high blood pressure, and general health maintenance. We also reviewed your breast health and colon cancer screening.  YOUR PLAN:  -THYROID NODULES: Thyroid nodules are growths that can form on your thyroid gland. Your recent ultrasound showed that these nodules have grown larger, and a biopsy is recommended to determine if they are benign or malignant. Please schedule a biopsy with interventional radiology.  -SUPRACLAVICULAR FULLNESS: Supraclavicular fullness refers to a noticeable swelling above your collarbone. The ultrasound technician suggested a biopsy instead of a repeat ultrasound. We will request a simultaneous biopsy of this area during your thyroid nodule biopsy if appropriate.  -HYPERTENSION: Hypertension, or high blood pressure, is when the force of your blood against your artery walls is too high. You mentioned that you have not been taking your medication regularly. Please resume taking Losartan as prescribed and ensure your pharmacy has your correct contact information. We will check your blood pressure again in 4 months or sooner if you experience any symptoms.  -BREAST HEALTH: Your breast implants are from the 1970s, and there were no abnormal findings on today's exam. It is important to update your mammogram to monitor for any potential issues.  -COLON CANCER SCREENING: Colon cancer screening helps detect early signs of colon cancer. You have received an at-home screening kit from your insurance company. Please complete and return the kit as soon as possible.  -GENERAL HEALTH MAINTENANCE: You received a flu shot today. We will have a follow-up appointment in 4 months to ensure completion of your biopsies and to check your blood pressure.  INSTRUCTIONS:  Please schedule a biopsy for your thyroid nodules and supraclavicular fullness with  interventional radiology. Resume taking your blood pressure medication, Losartan, as prescribed and ensure your pharmacy has your correct contact information. Complete and return the at-home colon cancer screening kit. Update your mammogram. We will see you again in 4 months to review your biopsy results and check your blood pressure.

## 2023-05-02 DIAGNOSIS — I10 Essential (primary) hypertension: Secondary | ICD-10-CM | POA: Diagnosis not present

## 2023-05-02 DIAGNOSIS — Z23 Encounter for immunization: Secondary | ICD-10-CM | POA: Diagnosis not present

## 2023-05-02 DIAGNOSIS — Z131 Encounter for screening for diabetes mellitus: Secondary | ICD-10-CM | POA: Diagnosis not present

## 2023-05-02 DIAGNOSIS — R599 Enlarged lymph nodes, unspecified: Secondary | ICD-10-CM | POA: Diagnosis not present

## 2023-05-02 DIAGNOSIS — E782 Mixed hyperlipidemia: Secondary | ICD-10-CM | POA: Diagnosis not present

## 2023-05-02 DIAGNOSIS — Z0000 Encounter for general adult medical examination without abnormal findings: Secondary | ICD-10-CM | POA: Diagnosis not present

## 2023-05-02 DIAGNOSIS — E041 Nontoxic single thyroid nodule: Secondary | ICD-10-CM | POA: Diagnosis not present

## 2023-05-03 ENCOUNTER — Other Ambulatory Visit: Payer: Self-pay | Admitting: Internal Medicine

## 2023-05-03 ENCOUNTER — Encounter: Payer: Self-pay | Admitting: Physician Assistant

## 2023-05-03 ENCOUNTER — Telehealth: Payer: Self-pay

## 2023-05-03 DIAGNOSIS — E042 Nontoxic multinodular goiter: Secondary | ICD-10-CM

## 2023-05-03 NOTE — Telephone Encounter (Signed)
Called pt to advise information per PCP below... lvm to return call with any questions or concerns with cb number.  Allwardt, Crist Infante, PA-C  Royann Shivers, CMA Please inform pt that biopsy will be going through Dr. Charlean Sanfilippo office. She should be contacted soon.       Previous Messages    ----- Message ----- From: Carlus Pavlov, MD Sent: 05/03/2023  10:35 AM EST To: Crist Infante Allwardt, PA-C Subject: RE: FNA                                        Sure, order is in for [facility]. Thank you! CG ----- Message ----- From: Bary Leriche, PA-C Sent: 05/03/2023   9:52 AM EST To: Carlus Pavlov, MD Subject: FNA                                            Hello!  Patient is agreeable with [place] [facility] if you can please have your team get this ordered and scheduled for her.  Thank you, Alyssa

## 2023-05-04 NOTE — Telephone Encounter (Signed)
Please see pt msg and advise 

## 2023-05-05 ENCOUNTER — Other Ambulatory Visit: Payer: Self-pay

## 2023-05-05 MED ORDER — ATORVASTATIN CALCIUM 20 MG PO TABS: 20.0000 mg | ORAL_TABLET | Freq: Every day | ORAL | 1 refills | Status: DC

## 2023-05-10 ENCOUNTER — Telehealth: Payer: Self-pay | Admitting: Physician Assistant

## 2023-05-10 NOTE — Telephone Encounter (Signed)
Pt states she is having a biopsy on her nodules at [facility], and wanted to know if you can send a referral to that office for a biopsy of her lymph nodes. Please advise.

## 2023-05-10 NOTE — Telephone Encounter (Signed)
Noted  

## 2023-05-10 NOTE — Telephone Encounter (Signed)
Please see patient call message and advise

## 2023-05-10 NOTE — Telephone Encounter (Signed)
Pt very upset that this can't be ordered now and done all at one time. Tried to explain to patient PCP recommendations and also offered to schedule a VV with patient to discuss concerns with PCP. Pt states her husband has thyroid cancer and she is just trying to catch things before to far gone since her lymph nodes are also swollen. Pt hung up and didn't schedule another appt or VV

## 2023-07-05 ENCOUNTER — Ambulatory Visit
Admission: RE | Admit: 2023-07-05 | Discharge: 2023-07-05 | Disposition: A | Discharge status: Home / Self Care | Payer: Medicare HMO | Source: Ambulatory Visit | Attending: Internal Medicine | Admitting: Internal Medicine

## 2023-07-05 ENCOUNTER — Other Ambulatory Visit (HOSPITAL_COMMUNITY)
Admission: RE | Admit: 2023-07-05 | Discharge: 2023-07-05 | Disposition: A | Discharge status: Home / Self Care | Payer: Medicare HMO | Source: Ambulatory Visit | Attending: Internal Medicine | Admitting: Internal Medicine

## 2023-07-05 DIAGNOSIS — E042 Nontoxic multinodular goiter: Secondary | ICD-10-CM

## 2023-07-05 DIAGNOSIS — E041 Nontoxic single thyroid nodule: Secondary | ICD-10-CM | POA: Diagnosis not present

## 2023-07-06 ENCOUNTER — Encounter: Payer: Self-pay | Admitting: Internal Medicine

## 2023-07-06 LAB — CYTOLOGY - NON PAP

## 2023-08-29 ENCOUNTER — Ambulatory Visit: Payer: Medicare HMO | Admitting: Physician Assistant

## 2023-08-29 ENCOUNTER — Encounter: Payer: Self-pay | Admitting: Physician Assistant

## 2023-08-29 VITALS — BP 138/88 | HR 66 | Temp 99.3°F | Ht 64.5 in | Wt 235.0 lb

## 2023-08-29 DIAGNOSIS — I10 Essential (primary) hypertension: Secondary | ICD-10-CM | POA: Diagnosis not present

## 2023-08-29 DIAGNOSIS — E041 Nontoxic single thyroid nodule: Secondary | ICD-10-CM

## 2023-08-29 DIAGNOSIS — E782 Mixed hyperlipidemia: Secondary | ICD-10-CM

## 2023-08-29 DIAGNOSIS — Z780 Asymptomatic menopausal state: Secondary | ICD-10-CM | POA: Diagnosis not present

## 2023-08-29 LAB — LIPID PANEL
Cholesterol: 156 mg/dL (ref 0–200)
HDL: 63.6 mg/dL (ref >39.00)
LDL Cholesterol: 82 mg/dL (ref 0–99)
NonHDL: 92.55
Total CHOL/HDL Ratio: 2
Triglycerides: 51 mg/dL (ref 0.0–149.0)
VLDL: 10.2 mg/dL (ref 0.0–40.0)

## 2023-08-29 MED ORDER — LOSARTAN POTASSIUM-HCTZ 50-12.5 MG PO TABS: 1.0000 | ORAL_TABLET | Freq: Every day | ORAL | 3 refills | Status: AC

## 2023-08-29 MED ORDER — METOPROLOL TARTRATE 25 MG PO TABS: 12.5000 mg | ORAL_TABLET | Freq: Every day | ORAL | 11 refills | Status: AC

## 2023-08-29 NOTE — Progress Notes (Unsigned)
 Patient ID: Misty Cabrera, female    DOB: [birth year], 73 y.o.   MRN: [id number]   Assessment & Plan:  Post-menopausal -     DG Bone Density; Future  Essential hypertension -     Losartan Potassium-HCTZ; Take 1 tablet by mouth daily.  Dispense: 90 tablet; Refill: 3 -     Metoprolol Tartrate; Take 0.5 tablets (12.5 mg total) by mouth daily.  Dispense: 45 tablet; Refill: 11  Mixed hyperlipidemia -     Lipid panel  Thyroid nodule   Assessment and Plan    Myalgia Symptoms consistent with statin-associated muscle symptoms. - Discontinue statin for three months to assess symptom improvement.  Hyperlipidemia Cholesterol levels elevated with ASCVD risk score of 20%. Discussed CT cardiac calcium score for coronary artery plaque assessment. - Order cholesterol panel. - Consider CT cardiac calcium score. - Discuss alternative cholesterol-lowering strategies if statin intolerance confirmed.  Hypertension Blood pressure borderline, current antihypertensive regimen effective. - Refill Lopressor and losartan prescriptions.  Thyroid nodules Nodules benign, managed by endocrinologist. - Continue follow-up with endocrinologist.  Weight gain Attributed to sedentary lifestyle, stress, and possible thyroid issues. - Encourage re-engagement with weight loss program and lifestyle modifications.  General Health Maintenance Due for bone density test given age and osteoporosis risk. - Order bone density test.  Follow-up Advised follow-up for condition management and symptom reassessment post-statin holiday. - Schedule follow-up appointment in six months.          Return in about 6 months (around 02/29/2024) for recheck/follow-up.    Subjective:    Chief Complaint  Patient presents with   Medical Management of Chronic Issues    Pt in office for 4 mon f/u since CPE; discussed needed Health Maintenance; pt needing BP check since running high at last visit and abnormal Lipid panel     HPI Discussed the use of AI scribe software for clinical note transcription with the patient, who gave verbal consent to proceed.  History of Present Illness   Misty Cabrera is a 73 year old female with hyperlipidemia who presents with generalized body aches and soreness.  She experiences generalized body aches and soreness, particularly noticeable when she first gets up or after sitting for a while. These symptoms began after her statin dosage was increased from 10 mg to 20 mg in November. She is currently taking the statin every night. She recalls a client who had similar symptoms on a statin, which resolved after changing the medication.  Her cholesterol levels were last checked four months ago and were elevated. She has a history of hyperlipidemia and is on medication for hypertension, which she tolerates well. However, she has not been regularly monitoring her blood pressure at home.  She has a history of thyroid nodules, previously evaluated and found to be benign, and she follows up with an endocrinologist for this condition.   She is concerned about weight gain, attributing it to her age, sedentary work lifestyle, and stress. She previously lost weight through a physician-supervised program but has regained some weight since the COVID-19 pandemic.       Past Medical History:  Diagnosis Date   Essential hypertension, benign 05/15/2019   Hyperthyroidism    Tachycardia 05/15/2019    Past Surgical History:  Procedure Laterality Date   AUGMENTATION MAMMAPLASTY  1980   TONSILLECTOMY      Family History  Problem Relation Age of Onset   Diabetes Maternal Aunt    Diabetes Maternal Uncle    Goiter Mother  Social History   Tobacco Use   Smoking status: Never   Smokeless tobacco: Never  Vaping Use   Vaping status: Never Used  Substance Use Topics   Alcohol use: No   Drug use: No     No Known Allergies  Review of Systems NEGATIVE UNLESS OTHERWISE INDICATED IN  HPI      Objective:     BP 138/88 (BP Location: Left Arm, Patient Position: Sitting, Cuff Size: Large)   Pulse 66   Temp 99.3 F (37.4 C) (Temporal)   Ht 5' 4.5" (1.638 m)   Wt 235 lb (106.6 kg)   SpO2 98%   BMI 39.71 kg/m   Wt Readings from Last 3 Encounters:  08/29/23 235 lb (106.6 kg)  04/27/23 229 lb 3.2 oz (104 kg)  10/06/22 230 lb 3.2 oz (104.4 kg)    BP Readings from Last 3 Encounters:  08/29/23 138/88  04/27/23 (!) 156/82  10/06/22 124/86     Physical Exam Vitals and nursing note reviewed.  Constitutional:      Appearance: Normal appearance.  Eyes:     Extraocular Movements: Extraocular movements intact.     Conjunctiva/sclera: Conjunctivae normal.     Pupils: Pupils are equal, round, and reactive to light.  Cardiovascular:     Rate and Rhythm: Normal rate and regular rhythm.  Pulmonary:     Effort: Pulmonary effort is normal.     Breath sounds: Normal breath sounds.  Neurological:     General: No focal deficit present.     Mental Status: She is alert and oriented to person, place, and time.  Psychiatric:        Mood and Affect: Mood normal.        Behavior: Behavior normal.        Keatyn Luck M Maecie Sevcik, PA-C

## 2023-08-30 ENCOUNTER — Encounter: Payer: Self-pay | Admitting: Physician Assistant

## 2023-10-06 ENCOUNTER — Ambulatory Visit: Payer: Medicare HMO | Admitting: Internal Medicine

## 2023-11-04 ENCOUNTER — Other Ambulatory Visit: Payer: Self-pay | Admitting: Physician Assistant

## 2024-02-29 ENCOUNTER — Encounter: Payer: Self-pay | Admitting: Physician Assistant

## 2024-02-29 ENCOUNTER — Ambulatory Visit (INDEPENDENT_AMBULATORY_CARE_PROVIDER_SITE_OTHER): Admitting: Physician Assistant

## 2024-02-29 VITALS — BP 138/80 | HR 80 | Temp 97.5°F | Ht 64.0 in | Wt 232.0 lb

## 2024-02-29 DIAGNOSIS — E782 Mixed hyperlipidemia: Secondary | ICD-10-CM

## 2024-02-29 DIAGNOSIS — E041 Nontoxic single thyroid nodule: Secondary | ICD-10-CM | POA: Diagnosis not present

## 2024-02-29 DIAGNOSIS — Z789 Other specified health status: Secondary | ICD-10-CM

## 2024-02-29 DIAGNOSIS — I10 Essential (primary) hypertension: Secondary | ICD-10-CM

## 2024-02-29 DIAGNOSIS — Z131 Encounter for screening for diabetes mellitus: Secondary | ICD-10-CM | POA: Diagnosis not present

## 2024-02-29 LAB — TSH: TSH: 1.65 u[IU]/mL (ref 0.35–5.50)

## 2024-02-29 LAB — LIPID PANEL
Cholesterol: 230 mg/dL — ABNORMAL HIGH (ref 0–200)
HDL: 60.2 mg/dL (ref >39.00)
LDL Cholesterol: 148 mg/dL — ABNORMAL HIGH (ref 0–99)
NonHDL: 169.5
Total CHOL/HDL Ratio: 4
Triglycerides: 110 mg/dL (ref 0.0–149.0)
VLDL: 22 mg/dL (ref 0.0–40.0)

## 2024-02-29 LAB — HEMOGLOBIN A1C: Hgb A1c MFr Bld: 5.9 % (ref 4.6–6.5)

## 2024-02-29 NOTE — Progress Notes (Signed)
 Patient ID: Misty Cabrera, female    DOB: 17-Jun-1950, 73 y.o.   MRN: [id number]   Assessment & Plan:  Statin intolerance  Mixed hyperlipidemia -     Lipid panel  Essential hypertension  Thyroid  nodule -     TSH  Screening for diabetes mellitus -     Hemoglobin A1c     Assessment & Plan Hypertension Hypertension is managed with losartan -hydrochlorothiazide. She is not taking metoprolol  regularly. No recent episodes of palpitations, chest pain, or fluttering reported. - Continue losartan -hydrochlorothiazide   Hyperlipidemia with statin intolerance Previously on Lipitor, discontinued due to muscle aches and bloodshot eyes. Currently not on any cholesterol medication. Discussed CT scan for plaque buildup, but she prefers to delay due to her busy schedule. - Recheck cholesterol levels today - Consider CT scan for plaque buildup in the future  Benign thyroid  nodule with hyperthyroidism Benign thyroid  nodule with previous hyperthyroidism. No current thyroid  medication. Previous biopsy showed benign results. Discussed monitoring thyroid  nodules with periodic ultrasounds. - Check thyroid  function tests today - Consider follow-up with endocrinologist for thyroid  nodule monitoring   General Health Maintenance Discussed the importance of regular health screenings and monitoring. No recent dermatology visits, but had a precancerous lesion treated in the past. Encouraged to monitor skin changes and seek evaluation if needed. - Perform lipid panel, A1c, and thyroid  function tests today - Encourage regular dermatology check-ups for skin monitoring      Return in about 6 months (around 08/28/2024) for recheck/follow-up.    Subjective:    Chief Complaint  Patient presents with   Follow-up    Wanted to discuss cholesterol since no longer on meds; doing a lot better off of it    HPI Discussed the use of AI scribe software for clinical note transcription with the patient, who  gave verbal consent to proceed.  History of Present Illness Misty Cabrera is a 73 year old female with hyperlipidemia and hypertension who presents for follow-up after discontinuing cholesterol medication due to side effects.  She experienced significant improvement in symptoms after discontinuing Lipitor, which she had been taking for hyperlipidemia. Severe side effects included bloodshot eyes, muscle aches, and difficulty walking, all of which resolved after stopping the medication. She is currently not on any cholesterol medication.  She is taking losartan  with hydrochlorothiazide for hypertension but often forgets to take her medication and is not taking metoprolol  at all. She describes her blood pressure as being okay and reports that her ankles stay puffy a lot, which she attributes to being overweight. No recent palpitations, chest pain, or fluttering.  She has a history of thyroid  nodules, which were biopsied and found to be benign. She has not been on thyroid  medication and has not followed up with her specialist recently.  She has a history of a precancerous lesion on her face, treated by a dermatologist. She has not had recent dermatological evaluations.  She reports significant weight gain since her mother's death and the cessation of dating, noting that she is the heaviest she has ever been.     Past Medical History:  Diagnosis Date   Essential hypertension, benign 05/15/2019   Hyperthyroidism    Tachycardia 05/15/2019    Past Surgical History:  Procedure Laterality Date   AUGMENTATION MAMMAPLASTY  1980   TONSILLECTOMY      Family History  Problem Relation Age of Onset   Diabetes Maternal Aunt    Diabetes Maternal Uncle    Goiter Mother  Social History   Tobacco Use   Smoking status: Never   Smokeless tobacco: Never  Vaping Use   Vaping status: Never Used  Substance Use Topics   Alcohol use: No   Drug use: No     Allergies  Allergen Reactions    Atorvastatin  Other (See Comments)    Significant myopathy and weakness    Review of Systems NEGATIVE UNLESS OTHERWISE INDICATED IN HPI      Objective:     BP 138/80   Pulse 80   Temp (!) 97.5 F (36.4 C)   Ht 5' 4 (1.626 m)   Wt 232 lb (105.2 kg)   SpO2 96%   BMI 39.82 kg/m   Wt Readings from Last 3 Encounters:  02/29/24 232 lb (105.2 kg)  08/29/23 235 lb (106.6 kg)  04/27/23 229 lb 3.2 oz (104 kg)    BP Readings from Last 3 Encounters:  02/29/24 138/80  08/29/23 138/88  04/27/23 (!) 156/82     Physical Exam Vitals and nursing note reviewed.  Constitutional:      Appearance: Normal appearance. She is obese.  Eyes:     Extraocular Movements: Extraocular movements intact.     Conjunctiva/sclera: Conjunctivae normal.     Pupils: Pupils are equal, round, and reactive to light.  Cardiovascular:     Rate and Rhythm: Normal rate and regular rhythm.     Pulses: Normal pulses.     Heart sounds: No murmur heard. Pulmonary:     Effort: Pulmonary effort is normal.     Breath sounds: Normal breath sounds.  Neurological:     General: No focal deficit present.     Mental Status: She is alert and oriented to person, place, and time.  Psychiatric:        Mood and Affect: Mood normal.        Behavior: Behavior normal.             Ayme Short M Kimari Lienhard, PA-C

## 2024-03-03 ENCOUNTER — Ambulatory Visit: Payer: Self-pay | Admitting: Physician Assistant

## 2024-03-04 ENCOUNTER — Other Ambulatory Visit: Payer: Self-pay

## 2024-03-04 ENCOUNTER — Other Ambulatory Visit: Payer: Self-pay | Admitting: Physician Assistant

## 2024-03-04 DIAGNOSIS — E782 Mixed hyperlipidemia: Secondary | ICD-10-CM

## 2024-03-04 MED ORDER — EZETIMIBE 10 MG PO TABS: 10.0000 mg | ORAL_TABLET | Freq: Every day | ORAL | 3 refills | Status: DC

## 2024-03-04 NOTE — Telephone Encounter (Signed)
 Pt is agreeable to prescription Statin, please advise dosage to be sent to pharmacy

## 2024-04-30 ENCOUNTER — Encounter: Payer: Self-pay | Admitting: Physician Assistant

## 2024-04-30 ENCOUNTER — Ambulatory Visit: Payer: Medicare HMO | Admitting: Physician Assistant

## 2024-04-30 VITALS — BP 136/88 | HR 70 | Temp 98.1°F | Ht 64.0 in | Wt 233.4 lb

## 2024-04-30 DIAGNOSIS — I10 Essential (primary) hypertension: Secondary | ICD-10-CM | POA: Diagnosis not present

## 2024-04-30 DIAGNOSIS — Z23 Encounter for immunization: Secondary | ICD-10-CM

## 2024-04-30 DIAGNOSIS — Z0000 Encounter for general adult medical examination without abnormal findings: Secondary | ICD-10-CM | POA: Diagnosis not present

## 2024-04-30 DIAGNOSIS — Z789 Other specified health status: Secondary | ICD-10-CM

## 2024-04-30 DIAGNOSIS — E782 Mixed hyperlipidemia: Secondary | ICD-10-CM

## 2024-04-30 DIAGNOSIS — Z85828 Personal history of other malignant neoplasm of skin: Secondary | ICD-10-CM

## 2024-04-30 NOTE — Progress Notes (Signed)
 Patient ID: Misty Cabrera, female    DOB: 01/24/1951, 73 y.o.   MRN: [id number]   Assessment & Plan:  Encounter for annual physical exam  Morbid obesity (HCC)  Mixed hyperlipidemia  Essential hypertension  Immunization due -     Flu vaccine HIGH DOSE PF(Fluzone Trivalent)  Statin intolerance  History of basal cell carcinoma     Assessment & Plan Adult Wellness Visit Annual wellness visit conducted. No significant health changes since last visit in September. Weight remains stable. No interest in lifestyle changes or screenings such as bone density, mammogram, or colon cancer screening. No dermatological concerns on examination. - Scheduled follow-up in six months  Morbid obesity BMI of 40.06. Weight has remained stable over the years. Discussed potential future availability of weight loss medications like Ozempic, contingent on insurance coverage changes. She expressed interest in these options if affordable. - Continue to monitor weight and discuss potential future options for weight loss medications  Mixed hyperlipidemia with statin intolerance Mixed hyperlipidemia with severe muscle aches on statins. Currently taking Zetia  inconsistently, approximately three times a week. Discussed coronary calcium  score as a potential option for risk stratification, but she is not interested at this time. Discussed the potential benefits of the scan for assessing plaque buildup and heart event risk. - Continue Zetia  as tolerated - Discussed coronary calcium  score as an option for future consideration  Essential hypertension Managed with Hyzaar and Lopressor .  - Continue current antihypertensive regimen  Immunization administration (high dose influenza vaccine) High dose influenza vaccine administered today. - Administered high dose influenza vaccine   AVS provided  Patient Counseling: [x]   Nutrition: Stressed importance of moderation in sodium/caffeine intake, saturated fat and  cholesterol, caloric balance, sufficient intake of fresh fruits, vegetables, and fiber.  [x]   Stressed the importance of regular exercise.   [x]   Substance Abuse: Discussed cessation/primary prevention of tobacco, alcohol, or other drug use; driving or other dangerous activities under the influence; availability of treatment for abuse.   []   Injury prevention: Discussed safety belts, safety helmets, smoke detector, smoking near bedding or upholstery.   []   Sexuality: Discussed sexually transmitted diseases, partner selection, use of condoms, avoidance of unintended pregnancy  and contraceptive alternatives.   [x]   Dental health: Discussed importance of regular tooth brushing, flossing, and dental visits.  [x]   Health maintenance and immunizations reviewed. Please refer to Health maintenance section.       Return in about 6 months (around 10/28/2024) for recheck/follow-up.    Subjective:    Chief Complaint  Patient presents with   Annual Exam    Pt in office for annual CPE and labs have been recently completed; pt still has Cologuard and not completed, pt states doesn't have time to complete mammogram and bone density at this time, last mammogram 2015.     HPI Discussed the use of AI scribe software for clinical note transcription with the patient, who gave verbal consent to proceed.  History of Present Illness Misty Cabrera is a 73 year old female who presents for an annual physical exam.  She is due for a high dose flu vaccine.  She has a diagnosis of morbid obesity with a BMI of 40.06. Her weight has remained stable over the years, but she wants to reduce it. She works long hours at [employer], which impacts her ability to engage in regular physical activity. She is often on her feet and has good upper body strength from her work. She is interested in  weight loss medications like Ozempic but is concerned about insurance coverage.  She has a history of hypertension and is currently  taking Hyzaar and Lopressor . Her blood pressure was high this morning, possibly due to the stress of the appointment and not taking her usual morning drink, Tremfit, which she believes helps with cholesterol and energy.  She has hyperlipidemia and is statin intolerant due to severe muscle aches. She is currently taking Zetia  for cholesterol management but does not take it as regularly as prescribed, approximately three times a week. She has not undergone a coronary calcium  score test.  She has a history of basal cell carcinoma left cheek and attributes her skin issues to past tanning bed use. She has not seen a dermatologist in a couple of years but had a precancerous lesion treated with hydrogen therapy previously.  She lives alone and has no family support, which affects her ability to undergo certain screenings like colon cancer screening that require accompaniment.     Past Medical History:  Diagnosis Date   Essential hypertension, benign 05/15/2019   Hyperthyroidism    Tachycardia 05/15/2019    Past Surgical History:  Procedure Laterality Date   AUGMENTATION MAMMAPLASTY  1980   TONSILLECTOMY      Family History  Problem Relation Age of Onset   Diabetes Maternal Aunt    Diabetes Maternal Uncle    Goiter Mother     Social History   Tobacco Use   Smoking status: Never   Smokeless tobacco: Never  Vaping Use   Vaping status: Never Used  Substance Use Topics   Alcohol use: No   Drug use: No     Allergies  Allergen Reactions   Atorvastatin  Other (See Comments)    Significant myopathy and weakness    Review of Systems NEGATIVE UNLESS OTHERWISE INDICATED IN HPI      Objective:     BP 136/88 (BP Location: Left Arm, Patient Position: Sitting, Cuff Size: Normal)   Pulse 70   Temp 98.1 F (36.7 C) (Temporal)   Ht 5' 4 (1.626 m)   Wt 233 lb 6.4 oz (105.9 kg)   SpO2 99%   BMI 40.06 kg/m   Wt Readings from Last 3 Encounters:  04/30/24 233 lb 6.4 oz (105.9 kg)   02/29/24 232 lb (105.2 kg)  08/29/23 235 lb (106.6 kg)    BP Readings from Last 3 Encounters:  04/30/24 136/88  02/29/24 138/80  08/29/23 138/88     Physical Exam Vitals and nursing note reviewed.  Constitutional:      Appearance: Normal appearance. She is obese.  Eyes:     Extraocular Movements: Extraocular movements intact.     Conjunctiva/sclera: Conjunctivae normal.     Pupils: Pupils are equal, round, and reactive to light.  Cardiovascular:     Rate and Rhythm: Normal rate and regular rhythm.     Pulses: Normal pulses.     Heart sounds: No murmur heard. Pulmonary:     Effort: Pulmonary effort is normal.     Breath sounds: Normal breath sounds.  Abdominal:     Comments: Declined exam  Skin:    Findings: No lesion or rash.  Neurological:     General: No focal deficit present.     Mental Status: She is alert and oriented to person, place, and time.     Gait: Gait normal.  Psychiatric:        Mood and Affect: Mood normal.        Behavior:  Behavior normal.             Lujain Kraszewski M Justice Aguirre, PA-C

## 2024-06-07 ENCOUNTER — Other Ambulatory Visit: Payer: Self-pay | Admitting: Physician Assistant

## 2024-07-03 NOTE — Progress Notes (Signed)
 Misty Cabrera                                          MRN: [id number]   07/03/2024   The [facility] Quality Team Specialist reviewed this patient medical record for the purposes of chart review for care gap closure. The following were reviewed: chart review for care gap closure-breast cancer screening and colorectal cancer screening.    [facility] Quality Team

## 2024-08-28 ENCOUNTER — Ambulatory Visit: Admitting: Physician Assistant

## 2024-10-29 ENCOUNTER — Ambulatory Visit: Admitting: Physician Assistant
